# Patient Record
Sex: Female | Born: 1950 | Race: Black or African American | Hispanic: No | Marital: Single | Smoking: Never smoker
Health system: Southern US, Community
[De-identification: ages and names within clinical notes are randomized; demographics above are authoritative.]

## PROBLEM LIST (undated history)

## (undated) DIAGNOSIS — H269 Unspecified cataract: Secondary | ICD-10-CM

## (undated) DIAGNOSIS — K219 Gastro-esophageal reflux disease without esophagitis: Secondary | ICD-10-CM

## (undated) DIAGNOSIS — I10 Essential (primary) hypertension: Secondary | ICD-10-CM

## (undated) DIAGNOSIS — M1990 Unspecified osteoarthritis, unspecified site: Secondary | ICD-10-CM

## (undated) DIAGNOSIS — E785 Hyperlipidemia, unspecified: Secondary | ICD-10-CM

## (undated) DIAGNOSIS — E119 Type 2 diabetes mellitus without complications: Secondary | ICD-10-CM

## (undated) HISTORY — PX: WISDOM TOOTH EXTRACTION: SHX21

## (undated) HISTORY — PX: TONSILLECTOMY: SUR1361

## (undated) HISTORY — DX: Hyperlipidemia, unspecified: E78.5

## (undated) HISTORY — PX: CATARACT EXTRACTION: SUR2

## (undated) HISTORY — DX: Type 2 diabetes mellitus without complications: E11.9

## (undated) HISTORY — DX: Gastro-esophageal reflux disease without esophagitis: K21.9

## (undated) HISTORY — DX: Essential (primary) hypertension: I10

## (undated) HISTORY — DX: Unspecified cataract: H26.9

## (undated) HISTORY — DX: Unspecified osteoarthritis, unspecified site: M19.90

---

## 1986-07-20 HISTORY — PX: TOTAL ABDOMINAL HYSTERECTOMY: SHX209

## 2003-08-11 ENCOUNTER — Emergency Department (HOSPITAL_COMMUNITY): Admission: EM | Admit: 2003-08-11 | Discharge: 2003-08-11 | Payer: Self-pay | Admitting: Internal Medicine

## 2003-10-02 ENCOUNTER — Encounter: Admission: RE | Admit: 2003-10-02 | Discharge: 2003-12-31 | Payer: Self-pay | Admitting: Internal Medicine

## 2004-03-19 ENCOUNTER — Encounter: Admission: RE | Admit: 2004-03-19 | Discharge: 2004-03-19 | Payer: Self-pay | Admitting: Internal Medicine

## 2006-03-16 ENCOUNTER — Encounter: Admission: RE | Admit: 2006-03-16 | Discharge: 2006-03-16 | Payer: Self-pay | Admitting: Internal Medicine

## 2007-08-22 ENCOUNTER — Emergency Department (HOSPITAL_COMMUNITY): Admission: EM | Admit: 2007-08-22 | Discharge: 2007-08-22 | Payer: Self-pay | Admitting: Emergency Medicine

## 2009-08-02 ENCOUNTER — Encounter: Admission: RE | Admit: 2009-08-02 | Discharge: 2009-08-02 | Payer: Self-pay | Admitting: Orthopedic Surgery

## 2013-02-10 ENCOUNTER — Encounter: Payer: Self-pay | Admitting: Internal Medicine

## 2013-12-17 ENCOUNTER — Encounter: Payer: Self-pay | Admitting: Internal Medicine

## 2014-05-23 ENCOUNTER — Encounter: Payer: Self-pay | Admitting: Internal Medicine

## 2014-06-20 ENCOUNTER — Ambulatory Visit (AMBULATORY_SURGERY_CENTER): Payer: Self-pay | Admitting: *Deleted

## 2014-06-20 VITALS — Ht 64.0 in | Wt 250.0 lb

## 2014-06-20 DIAGNOSIS — Z1211 Encounter for screening for malignant neoplasm of colon: Secondary | ICD-10-CM

## 2014-06-20 NOTE — Progress Notes (Signed)
Patient denies any allergies to eggs or soy. Patient denies any problems with anesthesia/sedation, states Hard to wake up after surgery. Patient denies any oxygen use at home and does not take any diet/weight loss medications. EMMI education assisgned to patient on colonoscopy, this was explained and instructions given to patient.

## 2014-07-04 ENCOUNTER — Encounter: Payer: Self-pay | Admitting: Internal Medicine

## 2014-07-04 ENCOUNTER — Ambulatory Visit (AMBULATORY_SURGERY_CENTER): Payer: BC Managed Care – PPO | Admitting: Internal Medicine

## 2014-07-04 VITALS — BP 139/75 | HR 63 | Temp 96.4°F | Resp 21 | Ht 64.0 in | Wt 250.0 lb

## 2014-07-04 DIAGNOSIS — D123 Benign neoplasm of transverse colon: Secondary | ICD-10-CM

## 2014-07-04 DIAGNOSIS — Z1211 Encounter for screening for malignant neoplasm of colon: Secondary | ICD-10-CM

## 2014-07-04 LAB — GLUCOSE, CAPILLARY
Glucose-Capillary: 186 mg/dL — ABNORMAL HIGH (ref 70–99)
Glucose-Capillary: 170 mg/dL — ABNORMAL HIGH (ref 70–99)

## 2014-07-04 MED ORDER — SODIUM CHLORIDE 0.9 % IV SOLN: 500.0000 mL | INTRAVENOUS | Status: DC

## 2014-07-04 MED ORDER — PRAMOXINE-HC 1-1 % EX CREA: TOPICAL_CREAM | CUTANEOUS | Status: DC

## 2014-07-04 NOTE — Progress Notes (Signed)
Called to room to assist during endoscopic procedure.  Patient ID and intended procedure confirmed with present staff. Received instructions for my participation in the procedure from the performing physician.  

## 2014-07-04 NOTE — Progress Notes (Signed)
Report to PACU, RN, vss, BBS= Clear.  

## 2014-07-04 NOTE — Patient Instructions (Signed)
YOU HAD AN ENDOSCOPIC PROCEDURE TODAY AT THE [facility]: Refer to the procedure report that was given to you for any specific questions about what was found during the examination.  If the procedure report does not answer your questions, please call your gastroenterologist to clarify.  If you requested that your care partner not be given the details of your procedure findings, then the procedure report has been included in a sealed envelope for you to review at your convenience later.  YOU SHOULD EXPECT: Some feelings of bloating in the abdomen. Passage of more gas than usual.  Walking can help get rid of the air that was put into your GI tract during the procedure and reduce the bloating. If you had a lower endoscopy (such as a colonoscopy or flexible sigmoidoscopy) you may notice spotting of blood in your stool or on the toilet paper. If you underwent a bowel prep for your procedure, then you may not have a normal bowel movement for a few days.  DIET: Your first meal following the procedure should be a light meal and then it is ok to progress to your normal diet.  A half-sandwich or bowl of soup is an example of a good first meal.  Heavy or fried foods are harder to digest and may make you feel nauseous or bloated.  Likewise meals heavy in dairy and vegetables can cause extra gas to form and this can also increase the bloating.  Drink plenty of fluids but you should avoid alcoholic beverages for 24 hours.  ACTIVITY: Your care partner should take you home directly after the procedure.  You should plan to take it easy, moving slowly for the rest of the day.  You can resume normal activity the day after the procedure however you should NOT DRIVE or use heavy machinery for 24 hours (because of the sedation medicines used during the test).    SYMPTOMS TO REPORT IMMEDIATELY: A gastroenterologist can be reached at any hour.  During normal business hours, 8:30 AM to 5:00 PM Monday through Friday,  call [phone].  After hours and on weekends, please call the GI answering service at [phone] who will take a message and have the physician on call contact you.   Following lower endoscopy (colonoscopy or flexible sigmoidoscopy):  Excessive amounts of blood in the stool  Significant tenderness or worsening of abdominal pains  Swelling of the abdomen that is new, acute  Fever of 100F or higher  FOLLOW UP: If any biopsies were taken you will be contacted by phone or by letter within the next 1-3 weeks.  Call your gastroenterologist if you have not heard about the biopsies in 3 weeks.  Our staff will call the home number listed on your records the next business day following your procedure to check on you and address any questions or concerns that you may have at that time regarding the information given to you following your procedure. This is a courtesy call and so if there is no answer at the home number and we have not heard from you through the emergency physician on call, we will assume that you have returned to your regular daily activities without incident.  SIGNATURES/CONFIDENTIALITY: You and/or your care partner have signed paperwork which will be entered into your electronic medical record.  These signatures attest to the fact that that the information above on your After Visit Summary has been reviewed and is understood.  Full responsibility of the confidentiality of this   discharge information lies with you and/or your care-partner.  Polyp, diverticulosis, and high fiber diet information given. Analpram cream as directed.

## 2014-07-04 NOTE — Op Note (Signed)
[facility] [place]  [address] [place], [address]   COLONOSCOPY PROCEDURE REPORT  PATIENT: Laurie Payne, Laurie Payne  MR#: [id number] BIRTHDATE: 02-13-1951 , 50  yrs. old GENDER: female ENDOSCOPIST: Lafayette Dragon, MD REFERRED BY:Dr  Morreira PROCEDURE DATE:  07/04/2014 PROCEDURE:   Colonoscopy with snare polypectomy First Screening Colonoscopy - Avg.  risk and is 50 yrs.  old or older - No.  Prior Negative Screening - Now for repeat screening. 10 or more years since last screening  History of Adenoma - Now for follow-up colonoscopy & has been > or = to 3 yrs.  N/A  Polyps Removed Today? Yes. ASA CLASS:   Class II INDICATIONS:average risk for colon cancer and prior colonoscopy in 2004 was normal. MEDICATIONS: Monitored anesthesia care and Propofol 300 mg IV  DESCRIPTION OF PROCEDURE:   After the risks benefits and alternatives of the procedure were thoroughly explained, informed consent was obtained.  The digital rectal exam revealed no abnormalities of the rectum.   The LB [id number]  endoscope was introduced through the anus and advanced to the cecum, which was identified by both the appendix and ileocecal valve. No adverse events experienced.   The quality of the prep was good, using MoviPrep  The instrument was then slowly withdrawn as the colon was fully examined.      COLON FINDINGS: There was mild diverticulosis noted in the descending colon with associated muscular hypertrophy and tortuosity.   A semi-pedunculated polyp measuring 9 mm in size was found in the transverse colon.  A polypectomy was performed with a cold snare.  Retroflexed views revealed no abnormalities. The time to cecum=6 minutes 08 seconds.  Withdrawal time=6 minutes 07 seconds.  The scope was withdrawn and the procedure completed. COMPLICATIONS: There were no immediate complications.  ENDOSCOPIC IMPRESSION: 1.   There was mild diverticulosis noted in the descending colon 2.    Semi-pedunculated polyp was found in the transverse colon; polypectomy was performed with a cold snare  RECOMMENDATIONS: 1.  Await pathology results 2.  High fiber diet Recall colonoscopy pending path report  eSigned:  Lafayette Dragon, MD 07/04/2014 10:47 AM   cc:   PATIENT NAME:  Laurie Payne, Laurie Payne MR#: [id number]

## 2014-07-05 ENCOUNTER — Telehealth: Payer: Self-pay | Admitting: *Deleted

## 2014-07-05 NOTE — Telephone Encounter (Signed)
  Follow up Call-  Call back number 07/04/2014  Post procedure Call Back phone  # [phone]  Permission to leave phone message Yes     Patient questions:  Do you have a fever, pain , or abdominal swelling? No. Pain Score  0 *  Have you tolerated food without any problems? Yes.    Have you been able to return to your normal activities? Yes.    Do you have any questions about your discharge instructions: Diet   No. Medications  No. Follow up visit  No.  Do you have questions or concerns about your Care? No.  Actions: * If pain score is 4 or above: No action needed, pain <4.

## 2014-07-10 ENCOUNTER — Encounter: Payer: Self-pay | Admitting: Internal Medicine

## 2014-10-30 ENCOUNTER — Other Ambulatory Visit: Payer: Self-pay | Admitting: Orthopedic Surgery

## 2014-10-30 DIAGNOSIS — M25511 Pain in right shoulder: Secondary | ICD-10-CM

## 2014-11-20 ENCOUNTER — Ambulatory Visit
Admission: RE | Admit: 2014-11-20 | Discharge: 2014-11-20 | Disposition: A | Discharge status: Home / Self Care | Payer: Medicare Other | Source: Ambulatory Visit | Attending: Orthopedic Surgery | Admitting: Orthopedic Surgery

## 2014-11-20 DIAGNOSIS — M25511 Pain in right shoulder: Secondary | ICD-10-CM

## 2014-11-20 MED ORDER — IOHEXOL 180 MG/ML  SOLN
15.0000 mL | Freq: Once | INTRAMUSCULAR | Status: AC | PRN
Start: 2014-11-20 — End: 2014-11-20

## 2014-11-28 ENCOUNTER — Ambulatory Visit: Payer: Medicare Other | Attending: Orthopedic Surgery | Admitting: Physical Therapy

## 2014-11-28 DIAGNOSIS — M25512 Pain in left shoulder: Secondary | ICD-10-CM | POA: Insufficient documentation

## 2014-11-28 DIAGNOSIS — M25612 Stiffness of left shoulder, not elsewhere classified: Secondary | ICD-10-CM | POA: Insufficient documentation

## 2014-11-28 DIAGNOSIS — R29898 Other symptoms and signs involving the musculoskeletal system: Secondary | ICD-10-CM | POA: Diagnosis not present

## 2014-11-28 DIAGNOSIS — R293 Abnormal posture: Secondary | ICD-10-CM | POA: Diagnosis not present

## 2014-11-28 NOTE — Therapy (Addendum)
[place], [name], [address] Phone: [phone]   Fax:  [phone]  Physical Therapy Evaluation  Patient Details  Name: Laurie Payne MRN: [id number] Date of Birth: 18-Apr-1951 Referring Provider:  Meredith Pel, MD  Encounter Date: 11/28/2014      PT End of Session - 11/28/14 1936    Visit Number 1   Number of Visits 16   Date for PT Re-Evaluation 01/23/15   PT Start Time 9357   PT Stop Time 1500   PT Time Calculation (min) 45 min   Activity Tolerance Patient tolerated treatment well;Patient limited by pain   Behavior During Therapy [facility] for tasks assessed/performed      Past Medical History  Diagnosis Date  . Hypertension   . Diabetes mellitus without complication   . Arthritis     Past Surgical History  Procedure Laterality Date  . Cataract extraction    . Total abdominal hysterectomy  1988    There were no vitals filed for this visit.  Visit Diagnosis:  Left shoulder pain - Plan: PT plan of care cert/re-cert  Weakness of left arm - Plan: PT plan of care cert/re-cert  Stiffness of left shoulder joint - Plan: PT plan of care cert/re-cert  Decreased ROM of left shoulder - Plan: PT plan of care cert/re-cert  Abnormal posture - Plan: PT plan of care cert/re-cert      Subjective Assessment - 11/28/14 1431    Subjective pt is a 64 y.o F with CC of R shoulder that started in Septemer of 2015. she reports that symptoms have gotten worse since onset. pt reports that she is R hand dominate.   Limitations Lifting;House hold activities  reaching   How long can you sit comfortably? unlimited   How long can you stand comfortably? 5 min   How long can you walk comfortably? unlimited if she can keep the arm hanging down.    Diagnostic tests 11/20/2014 MRI per pt report  a small hole in the supraspinatus.    Patient Stated Goals to get the arm feeling better.    Currently in Pain? Yes    Pain Score 9    Pain Location Shoulder   Pain Orientation Right   Pain Descriptors / Indicators Sharp;Shooting   Pain Type Chronic pain   Pain Radiating Towards refers to the R elbow and intermittent wrist   Pain Onset More than a month ago   Pain Frequency Constant   Aggravating Factors  laying, lifting carrying, sitting with elbow on the chair.   Pain Relieving Factors ice, heat             [facility] PT Assessment - 11/28/14 1436    Assessment   Medical Diagnosis r frozen shoulder   Onset Date --  September 2015   Next MD Visit --  1 month   Prior Therapy yes  low back (many years ago)   Precautions   Precautions None   Restrictions   Weight Bearing Restrictions No   Balance Screen   Has the patient fallen in the past 6 months Yes   How many times? 1   Has the patient had a decrease in activity level because of a fear of falling?  No   Is the patient reluctant to leave their home because of a fear of falling?  No   Home Environment   Living Enviornment Private residence   Living Arrangements Alone   Type of Home  House   Home Access Level entry   Home Layout One level   Prior Function   Level of Independence Independent with homemaking with ambulation;Independent with basic ADLs;Independent with gait;Independent with transfers   Vocation Retired   Leisure aerobics, line dancing, chair yoga, sowing, crocheting.   Cognition   Overall Cognitive Status Within Functional Limits for tasks assessed   Observation/Other Assessments   Focus on Therapeutic Outcomes (FOTO)  63% limited  predicted 40% limited   Posture/Postural Control   Posture/Postural Control Postural limitations   Postural Limitations Forward head;Rounded Shoulders   ROM / Strength   AROM / PROM / Strength AROM;PROM;Strength   AROM   Overall AROM Comments L UE within funcitonal limites   AROM Assessment Site Shoulder   Right Shoulder Extension 30 Degrees   Right Shoulder Flexion 140 Degrees  pain  during and at endrange   Right Shoulder ABduction 98 Degrees  with pain at endrange   Right Shoulder Internal Rotation 40 Degrees   Right Shoulder External Rotation 45 Degrees   PROM   Overall PROM Comments L UE WFL    Right Shoulder Extension 33 Degrees   Right Shoulder Flexion 160 Degrees  pain during    Right Shoulder ABduction 100 Degrees  pain during motion   Right Shoulder Internal Rotation 48 Degrees   Right Shoulder External Rotation 45 Degrees   Strength   Right/Left Shoulder Right;Left   Right Shoulder Flexion 3/5   Right Shoulder Extension 3+/5   Right Shoulder ABduction 3-/5   Right Shoulder Internal Rotation 3+/5   Right Shoulder External Rotation 3+/5   Left Shoulder Flexion 5/5   Left Shoulder Extension 5/5   Left Shoulder ABduction 5/5   Left Shoulder Internal Rotation 5/5   Left Shoulder External Rotation 5/5   Left Shoulder Horizontal ABduction 5/5   Left Shoulder Horizontal ADduction 5/5   Grip (lbs) 73  L   Grip (lbs) 54  R   Palpation   Palpation tenderness located at the muscle belly of the supraspintus  pt had difficulty telling where the pain was   Special Tests    Special Tests Rotator Cuff Impingement   Rotator Cuff Impingment tests Neer impingement test;Hawkins- Merrilyn Puma test;Empty Can test;Full Can test;Drop Arm test   Neer Impingement test    Findings Positive   Hawkins-Kennedy test   Findings Negative   Empty Can test   Findings Positive   Side Right   Full Can test   Findings Positive   Drop Arm test   Findings Negative                           PT Education - 11/28/14 1936    Education provided Yes   Education Details evaluation findings, POC, goals, HEP   Person(s) Educated Patient   Methods Explanation   Comprehension Verbalized understanding          PT Short Term Goals - 11/28/14 1945    PT SHORT TERM GOAL #1   Title pt will be I with basic HEP 12/29/2014   Time 4   Period Weeks   Status New   PT  SHORT TERM GOAL #2   Title pt will increase R shoulder flexion and abduction by > 15 degrees to assist with functional progression 12/29/2014   Time 4   Period Weeks   Status New   PT SHORT TERM GOAL #3   Title pt will decrease R shoulder  pain to <6/10 to help with exercise progression and ADLs 12/29/2014   Time 4   Period Weeks   Status New   PT SHORT TERM GOAL #4   Title she will increase R shoulder strength to 4-/5 to help with ADLs 12/29/2014   Time 4   Period Weeks   Status New   PT SHORT TERM GOAL #5   Title she will be able to verbalize and demonstrate techniques to reduce inflammation via RICE method 12/29/2014   Time 4   Period Weeks   Status New           PT Long Term Goals - 20-Dec-2014 1948    PT LONG TERM GOAL #1   Title pt will be I with advanced HEP (01/23/15)   Time 8   Period Weeks   Status New   PT LONG TERM GOAL #2   Title pt will increase R overall Shoulder AROM to [facility] compared bil to assist with personal hygiene and ADLs (01/23/15)   Time 8   Period Weeks   Status New   PT LONG TERM GOAL #3   Title pt will decrase pain to <4/10 during and following overhead lifting of >5# to assist wtih ADLs (01/23/15)   Time 8   Period Weeks   Status New   PT LONG TERM GOAL #4   Title pt will increase R shoulder strength to >4+/5 to help with household cleaning, and lifting and carrying activities (01/23/15)   Time 8   Period Weeks   Status New   PT LONG TERM GOAL #5   Title pt will increase FOTO score to >60% to assist with functional capacity (01/23/15)   Time 8   Period Weeks   Status New   Additional Long Term Goals   Additional Long Term Goals Yes               Plan - December 20, 2014 1937    Clinical Impression Statement Laurie Payne presents to OPPT with CC of R shoulder pain that has been going on for a couple months, and the pt is R hand dominant. She demonstrates limited AROM and PROM compared bil with pain noted at endrange. pt reported diffiuclty tryin to find a  spot that was more tender or sore, upon palpation she was tender along the muscle belly of the supraspinatus.  MMT revealed general R shoulder weakness in all planes with the most defecit in flexion being 3/5 and abduction being 3-/5 seconary to pain. Special testing was postive with empty/full can with empty can being worse. She would benefit from skilled physical therapy to maximize her functiona and decrease pain by addressing the impairments listed.    Pt will benefit from skilled therapeutic intervention in order to improve on the following deficits Decreased strength;Pain;Impaired UE functional use;Decreased mobility;Decreased activity tolerance;Decreased range of motion;Increased fascial restricitons;Improper body mechanics;Decreased endurance;Impaired flexibility   Rehab Potential Good   PT Frequency 2x / week   PT Duration 8 weeks   PT Treatment/Interventions ADLs/Self Care Home Management;Ultrasound;Manual techniques;Therapeutic activities;Therapeutic exercise;Cryotherapy;Electrical Stimulation;Neuromuscular re-education;Passive range of motion;Dry needling;Patient/family education;Moist Heat   PT Next Visit Plan Assess response to HEP, modalities PRN, Shoulder AAROM/PROM,    PT Home Exercise Plan wand flexion, abduction, pendulums   Consulted and Agree with Plan of Care Patient          G-Codes - Dec 20, 2014 2000    Functional Assessment Tool Used FOTO 63% limited   Functional Limitation Carrying, moving and handling objects  Carrying, Moving and Handling Objects Current Status [phone]) At least 60 percent but less than 80 percent impaired, limited or restricted   Carrying, Moving and Handling Objects Goal Status ([id number]) At least 20 percent but less than 40 percent impaired, limited or restricted       Problem List There are no active problems to display for this patient.  Starr Lake PT, DPT, LAT, ATC  11/28/2014  8:01 PM   [facility] [address] [place], [name], [address] Phone: [phone]   Fax:  [phone]

## 2014-11-28 NOTE — Patient Instructions (Signed)
   Akyah Lagrange PT, DPT, LAT, ATC  [facility] Phone: [phone]     

## 2014-12-03 ENCOUNTER — Ambulatory Visit: Payer: Medicare Other | Admitting: Physical Therapy

## 2014-12-03 DIAGNOSIS — M25512 Pain in left shoulder: Secondary | ICD-10-CM | POA: Diagnosis not present

## 2014-12-03 DIAGNOSIS — M25511 Pain in right shoulder: Secondary | ICD-10-CM

## 2014-12-03 DIAGNOSIS — R293 Abnormal posture: Secondary | ICD-10-CM

## 2014-12-03 DIAGNOSIS — M25621 Stiffness of right elbow, not elsewhere classified: Secondary | ICD-10-CM

## 2014-12-03 DIAGNOSIS — R29898 Other symptoms and signs involving the musculoskeletal system: Secondary | ICD-10-CM

## 2014-12-03 DIAGNOSIS — M25611 Stiffness of right shoulder, not elsewhere classified: Secondary | ICD-10-CM

## 2014-12-03 NOTE — Patient Instructions (Signed)
Remove tape if irritating 

## 2014-12-03 NOTE — Therapy (Signed)
[place], [name], [address] Phone: [phone]   Fax:  [phone]  Physical Therapy Treatment  Patient Details  Name: Laurie Payne MRN: [id number] Date of Birth: [birth year] Referring Provider:  Jilda Panda, MD  Encounter Date: 12/03/2014      PT End of Session - 12/03/14 1259    Visit Number 2   Number of Visits 16   Date for PT Re-Evaluation 01/23/15   PT Start Time 1146   PT Stop Time 1233   PT Time Calculation (min) 47 min   Activity Tolerance Patient tolerated treatment well;Patient limited by pain      Past Medical History  Diagnosis Date  . Hypertension   . Diabetes mellitus without complication   . Arthritis     Past Surgical History  Procedure Laterality Date  . Cataract extraction    . Total abdominal hysterectomy  1988    There were no vitals filed for this visit.  Visit Diagnosis:  Abnormal posture  Right shoulder pain  Weakness of right arm  Stiffness of right upper arm joint  Decreased ROM of right shoulder      Subjective Assessment - 12/03/14 1154    Subjective 8/10 with lifting, reaching.  Exercises have helped her sleep better.Stil can't sleep on that shoulder   Currently in Pain? No/denies   Pain Score 8    Pain Location Shoulder   Pain Orientation Right   Pain Descriptors / Indicators Sharp;Shooting;Aching   Pain Radiating Towards down arm   Aggravating Factors  driving, lifting, classes, sleeping on it, reaching and lifting, flushing the commode.   Pain Relieving Factors exercises, hot shower heating pad help for the moment.   Medication.     Multiple Pain Sites No                         [facility] Adult PT Treatment/Exercise - 12/03/14 1150    Posture/Postural Control   Posture Comments education about posture and shoulder,skeleton used pillow positioning for improved blood flow to area.   Shoulder Exercises: Standing   Flexion --   cane flexion, AA with cues for speed, slower posture   ABduction --  cane with cues for speed posture   Other Standing Exercises pendelum practiced, good technique.   Manual Therapy   Manual therapy comments soft tissur work Rt shoulder. tender areas softened.  Skin washed and dried (from lotion) Kinesiotex taping for inhibition of deltoid, suprascapulatis, and posture correction                PT Education - 12/03/14 1310    Education provided Yes   Education Details posetur ed, how it affects shoulder function, wear and tear.   Person(s) Educated Patient   Methods Explanation;Demonstration  skeleton used   Comprehension Verbalized understanding          PT Short Term Goals - 12/03/14 1302    PT SHORT TERM GOAL #1   Title pt will be I with basic HEP 12/29/2014   Time 4   Period Weeks   Status Achieved   PT SHORT TERM GOAL #2   Title pt will increase R shoulder flexion and abduction by > 15 degrees to assist with functional progression 12/29/2014   Time 4   Period Weeks   Status On-going   PT SHORT TERM GOAL #3   Title pt will decrease R shoulder pain to <6/10 to help with exercise  progression and ADLs 12/29/2014   Time 4   Period Weeks   Status On-going   PT SHORT TERM GOAL #4   Title she will increase R shoulder strength to 4-/5 to help with ADLs 12/29/2014   Time 4   Period Weeks   Status On-going           PT Long Term Goals - 11/28/14 1948    PT LONG TERM GOAL #1   Title pt will be I with advanced HEP (01/23/15)   Time 8   Period Weeks   Status New   PT LONG TERM GOAL #2   Title pt will increase R overall Shoulder AROM to [facility] compared bil to assist with personal hygiene and ADLs (01/23/15)   Time 8   Period Weeks   Status New   PT LONG TERM GOAL #3   Title pt will decrase pain to <4/10 during and following overhead lifting of >5# to assist wtih ADLs (01/23/15)   Time 8   Period Weeks   Status New   PT LONG TERM GOAL #4   Title pt will increase R  shoulder strength to >4+/5 to help with household cleaning, and lifting and carrying activities (01/23/15)   Time 8   Period Weeks   Status New   PT LONG TERM GOAL #5   Title pt will increase FOTO score to >60% to assist with functional capacity (01/23/15)   Time 8   Period Weeks   Status New   Additional Long Term Goals   Additional Long Term Goals Yes               Plan - 12/03/14 1301    Consulted and Agree with Plan of Care Patient    Next visit, doorway stretch, ER, ROW, AAROM    Problem List There are no active problems to display for this patient.   [facility] 12/03/2014, 1:12 PM  [facility] [address] [place], [name], [address] Phone: [phone]   Fax:  [phone]

## 2014-12-05 ENCOUNTER — Ambulatory Visit: Payer: Medicare Other | Admitting: Physical Therapy

## 2014-12-05 DIAGNOSIS — R293 Abnormal posture: Secondary | ICD-10-CM

## 2014-12-05 DIAGNOSIS — R29898 Other symptoms and signs involving the musculoskeletal system: Secondary | ICD-10-CM

## 2014-12-05 DIAGNOSIS — M25512 Pain in left shoulder: Secondary | ICD-10-CM | POA: Diagnosis not present

## 2014-12-05 DIAGNOSIS — M25611 Stiffness of right shoulder, not elsewhere classified: Secondary | ICD-10-CM

## 2014-12-05 DIAGNOSIS — M25511 Pain in right shoulder: Secondary | ICD-10-CM

## 2014-12-05 DIAGNOSIS — M25621 Stiffness of right elbow, not elsewhere classified: Secondary | ICD-10-CM

## 2014-12-05 NOTE — Therapy (Signed)
[place], [name], [address] Phone: [phone]   Fax:  [phone]  Physical Therapy Treatment  Patient Details  Name: Laurie Payne MRN: [id number] Date of Birth: 03-Nov-1950 Referring Provider:  Jilda Panda, MD  Encounter Date: 12/05/2014    Past Medical History  Diagnosis Date  . Hypertension   . Diabetes mellitus without complication   . Arthritis     Past Surgical History  Procedure Laterality Date  . Cataract extraction    . Total abdominal hysterectomy  1988    There were no vitals filed for this visit.  Visit Diagnosis:  Right shoulder pain  Weakness of right arm  Stiffness of right upper arm joint  Abnormal posture  Decreased ROM of right shoulder      Subjective Assessment - 12/05/14 1335    Subjective pt reports overall that she is doing well since the last visit. "It can't be more than a 3/10 but otherwise its not bad"   Currently in Pain? Yes   Pain Score 3    Pain Location Shoulder   Pain Orientation Right   Pain Descriptors / Indicators Aching   Pain Type Chronic pain   Pain Onset More than a month ago   Pain Frequency Intermittent                         [facility] Adult PT Treatment/Exercise - 12/05/14 1337    Shoulder Exercises: Supine   Protraction AROM;Strengthening;Right;10 reps  2 sets    Shoulder Exercises: Standing   Extension AROM;Strengthening;Both;10 reps;Theraband  2 sets   Theraband Level (Shoulder Extension) Level 2 (Red)   Row AROM;Strengthening;Both;10 reps;Theraband  2 sets    Theraband Level (Shoulder Row) Level 2 (Red)   Retraction AROM;Strengthening;Both;10 reps;Theraband  2 sets with ER    Theraband Level (Shoulder Retraction) Level 1 (Yellow)   Shoulder Exercises: ROM/Strengthening   UBE (Upper Arm Bike) L1 x 6 min  alt dir every 3 min   Other ROM/Strengthening Exercises UE Ranger 2 x 15 ea. with flexion / ext   Modalities   Modalities Cryotherapy;Electrical Stimulation   Cryotherapy   Number Minutes Cryotherapy 10 Minutes   Cryotherapy Location Shoulder   Type of Cryotherapy Ice pack   Electrical Stimulation   Electrical Stimulation Location R shoulder   Electrical Stimulation Action IFC   Electrical Stimulation Parameters 10 min to tolerance   Electrical Stimulation Goals Pain   Manual Therapy   Manual therapy comments grade 2-3 scapular and GH mobilzation in all directions                  PT Short Term Goals - 12/03/14 1302    PT SHORT TERM GOAL #1   Title pt will be I with basic HEP 12/29/2014   Time 4   Period Weeks   Status Achieved   PT SHORT TERM GOAL #2   Title pt will increase R shoulder flexion and abduction by > 15 degrees to assist with functional progression 12/29/2014   Time 4   Period Weeks   Status On-going   PT SHORT TERM GOAL #3   Title pt will decrease R shoulder pain to <6/10 to help with exercise progression and ADLs 12/29/2014   Time 4   Period Weeks   Status On-going   PT SHORT TERM GOAL #4   Title she will increase R shoulder strength to 4-/5 to help with ADLs 12/29/2014  Time 4   Period Weeks   Status On-going           PT Long Term Goals - 11/28/14 1948    PT LONG TERM GOAL #1   Title pt will be I with advanced HEP (01/23/15)   Time 8   Period Weeks   Status New   PT LONG TERM GOAL #2   Title pt will increase R overall Shoulder AROM to [facility] compared bil to assist with personal hygiene and ADLs (01/23/15)   Time 8   Period Weeks   Status New   PT LONG TERM GOAL #3   Title pt will decrase pain to <4/10 during and following overhead lifting of >5# to assist wtih ADLs (01/23/15)   Time 8   Period Weeks   Status New   PT LONG TERM GOAL #4   Title pt will increase R shoulder strength to >4+/5 to help with household cleaning, and lifting and carrying activities (01/23/15)   Time 8   Period Weeks   Status New   PT LONG TERM GOAL #5   Title pt  will increase FOTO score to >60% to assist with functional capacity (01/23/15)   Time 8   Period Weeks   Status New   Additional Long Term Goals   Additional Long Term Goals Yes               Plan - 12/05/14 1509    Clinical Impression Statement Alejah reports that she is doing better since the last visit, but continues have pain during AROM with flexion and abduction. She reports that she likes the taping and that it is helping. She tolerated exercises well witn minimal complaint of pain in the shoulder during ER and bicep curls. Plan to progress with strengthening as tolerated.    PT Next Visit Plan , modalities PRN, Shoulder AAROM/PROM,   PT Home Exercise Plan wand flexion, abduction, pendulums   Consulted and Agree with Plan of Care Patient        Problem List There are no active problems to display for this patient.  Starr Lake PT, DPT, LAT, ATC  12/05/2014  3:14 PM    [facility] [address] [place], [name], [address] Phone: [phone]   Fax:  [phone]

## 2014-12-10 ENCOUNTER — Ambulatory Visit: Payer: Medicare Other | Admitting: Physical Therapy

## 2014-12-10 DIAGNOSIS — R29898 Other symptoms and signs involving the musculoskeletal system: Secondary | ICD-10-CM

## 2014-12-10 DIAGNOSIS — M25621 Stiffness of right elbow, not elsewhere classified: Secondary | ICD-10-CM

## 2014-12-10 DIAGNOSIS — M25512 Pain in left shoulder: Secondary | ICD-10-CM | POA: Diagnosis not present

## 2014-12-10 DIAGNOSIS — M25511 Pain in right shoulder: Secondary | ICD-10-CM

## 2014-12-10 DIAGNOSIS — M25611 Stiffness of right shoulder, not elsewhere classified: Secondary | ICD-10-CM

## 2014-12-10 DIAGNOSIS — R293 Abnormal posture: Secondary | ICD-10-CM

## 2014-12-10 NOTE — Therapy (Signed)
[place], [name], [address] Phone: [phone]   Fax:  [phone]  Physical Therapy Treatment  Patient Details  Name: Laurie Payne MRN: [id number] Date of Birth: 1950-08-14 Referring Provider:  Jilda Panda, MD  Encounter Date: 12/10/2014      PT End of Session - 12/10/14 1201    Visit Number 4   Number of Visits 16   Date for PT Re-Evaluation 01/23/15   PT Start Time 1100   PT Stop Time 1158   PT Time Calculation (min) 58 min   Activity Tolerance Patient tolerated treatment well   Behavior During Therapy [facility] for tasks assessed/performed      Past Medical History  Diagnosis Date  . Hypertension   . Diabetes mellitus without complication   . Arthritis     Past Surgical History  Procedure Laterality Date  . Cataract extraction    . Total abdominal hysterectomy  1988    There were no vitals filed for this visit.  Visit Diagnosis:  Right shoulder pain  Weakness of right arm  Stiffness of right upper arm joint  Abnormal posture  Decreased ROM of right shoulder      Subjective Assessment - 12/10/14 1107    Subjective Pt reports that she has been doing better since the last visit, but conitnues to have intermittent pain in the shoulder with reaching and grabbing   Currently in Pain? Yes   Pain Score 4    Pain Location Shoulder   Pain Orientation Right   Pain Descriptors / Indicators Aching   Pain Type Chronic pain   Pain Onset More than a month ago   Pain Frequency Intermittent   Aggravating Factors  lifting, driving, sleeping on it, and reaching   Pain Relieving Factors exercises, hot shower and heating pad.            [facility] PT Assessment - 12/10/14 0001    AROM   Right Shoulder Flexion 132 Degrees   Right Shoulder ABduction 130 Degrees                     [facility] Adult PT Treatment/Exercise - 12/10/14 0001    Shoulder Exercises: Standing   External  Rotation AROM;Strengthening;10 reps;Theraband  2    Theraband Level (Shoulder External Rotation) Level 2 (Red)   Internal Rotation AROM;Strengthening;Right;Theraband;10 reps  2 x   Theraband Level (Shoulder Internal Rotation) Level 2 (Red)   Flexion AROM;Strengthening;Right  scaption   Extension AROM;Strengthening;Both;10 reps;Theraband   Theraband Level (Shoulder Extension) Level 3 (Green)   Row AROM;Strengthening;Both;10 reps;Theraband   Theraband Level (Shoulder Row) Level 3 (Green)   Retraction AROM;Strengthening;Both;10 reps;Theraband   Theraband Level (Shoulder Retraction) Level 2 (Red)   Shoulder Exercises: Pulleys   Flexion 2 minutes   ABduction 2 minutes   Shoulder Exercises: ROM/Strengthening   UBE (Upper Arm Bike) L1.5 x 6 min   Cryotherapy   Number Minutes Cryotherapy 10 Minutes   Cryotherapy Location Shoulder   Type of Cryotherapy Ice pack   Electrical Stimulation   Electrical Stimulation Location R shoulder   Electrical Stimulation Action IFC   Electrical Stimulation Parameters 10 min 100% scan to tolerance   Electrical Stimulation Goals Pain   Manual Therapy   Manual therapy comments grade 2-3 scapular and GH mobilzation in all directions                PT Education - 12/10/14 1244    Education provided Yes  Education Details scaption exercises, and bicep stretch   Person(s) Educated Patient   Methods Explanation   Comprehension Verbalized understanding          PT Short Term Goals - 12/03/14 1302    PT SHORT TERM GOAL #1   Title pt will be I with basic HEP 12/29/2014   Time 4   Period Weeks   Status Achieved   PT SHORT TERM GOAL #2   Title pt will increase R shoulder flexion and abduction by > 15 degrees to assist with functional progression 12/29/2014   Time 4   Period Weeks   Status On-going   PT SHORT TERM GOAL #3   Title pt will decrease R shoulder pain to <6/10 to help with exercise progression and ADLs 12/29/2014   Time 4   Period  Weeks   Status On-going   PT SHORT TERM GOAL #4   Title she will increase R shoulder strength to 4-/5 to help with ADLs 12/29/2014   Time 4   Period Weeks   Status On-going           PT Long Term Goals - 11/28/14 1948    PT LONG TERM GOAL #1   Title pt will be I with advanced HEP (01/23/15)   Time 8   Period Weeks   Status New   PT LONG TERM GOAL #2   Title pt will increase R overall Shoulder AROM to [facility] compared bil to assist with personal hygiene and ADLs (01/23/15)   Time 8   Period Weeks   Status New   PT LONG TERM GOAL #3   Title pt will decrase pain to <4/10 during and following overhead lifting of >5# to assist wtih ADLs (01/23/15)   Time 8   Period Weeks   Status New   PT LONG TERM GOAL #4   Title pt will increase R shoulder strength to >4+/5 to help with household cleaning, and lifting and carrying activities (01/23/15)   Time 8   Period Weeks   Status New   PT LONG TERM GOAL #5   Title pt will increase FOTO score to >60% to assist with functional capacity (01/23/15)   Time 8   Period Weeks   Status New   Additional Long Term Goals   Additional Long Term Goals Yes               Plan - 12/10/14 1203    Clinical Impression Statement Laurie Payne present to therapy today with report that she feels she is getting better but continues to demonstrate limited AROM at 130 degrees abd and 138 of flexion with pain at end range. she tolerated exercises well today with report of some tenderness during scaption exercises which she reported that she felt better after the first exercise. plan to continue with treatment as tolerated.    PT Next Visit Plan , modalities PRN, Shoulder AAROM/PROM,   PT Home Exercise Plan scaption exercises, and bicep stretch        Problem List There are no active problems to display for this patient.  Starr Lake PT, DPT, LAT, ATC  12/10/2014  12:47 PM   [facility] [address] [place], [name], [address] Phone: [phone]   Fax:  [phone]

## 2014-12-10 NOTE — Patient Instructions (Signed)
   Michele Kerlin PT, DPT, LAT, ATC  [facility] Phone: [phone]     

## 2014-12-12 ENCOUNTER — Ambulatory Visit: Payer: Medicare Other | Admitting: Physical Therapy

## 2014-12-12 DIAGNOSIS — M25611 Stiffness of right shoulder, not elsewhere classified: Secondary | ICD-10-CM

## 2014-12-12 DIAGNOSIS — R293 Abnormal posture: Secondary | ICD-10-CM

## 2014-12-12 DIAGNOSIS — M25621 Stiffness of right elbow, not elsewhere classified: Secondary | ICD-10-CM

## 2014-12-12 DIAGNOSIS — R29898 Other symptoms and signs involving the musculoskeletal system: Secondary | ICD-10-CM

## 2014-12-12 DIAGNOSIS — M25512 Pain in left shoulder: Secondary | ICD-10-CM | POA: Diagnosis not present

## 2014-12-12 DIAGNOSIS — M25511 Pain in right shoulder: Secondary | ICD-10-CM

## 2014-12-12 NOTE — Therapy (Addendum)
[place], [name], [address] Phone: [phone]   Fax:  [phone]  Physical Therapy Treatment  Patient Details  Name: Laurie Payne MRN: [id number] Date of Birth: 02-08-1951 Referring Provider:  Jilda Panda, MD  Encounter Date: 12/12/2014      PT End of Session - 12/12/14 1536    Visit Number 5   Number of Visits 16   Date for PT Re-Evaluation 01/23/15   PT Start Time 4742   PT Stop Time 1520   PT Time Calculation (min) 65 min   Activity Tolerance Patient tolerated treatment well   Behavior During Therapy [facility] for tasks assessed/performed      Past Medical History  Diagnosis Date  . Hypertension   . Diabetes mellitus without complication   . Arthritis     Past Surgical History  Procedure Laterality Date  . Cataract extraction    . Total abdominal hysterectomy  1988    There were no vitals filed for this visit.  Visit Diagnosis:  Right shoulder pain  Weakness of right arm  Stiffness of right upper arm joint  Abnormal posture  Decreased ROM of right shoulder      Subjective Assessment - 12/12/14 1421    Subjective " I was feeling good yesterday and had no pain and was able to mow the lawn." she reports some soreness today with report of doing an exercise class today.    Currently in Pain? Yes   Pain Score 2    Pain Location Shoulder   Pain Orientation Right   Pain Descriptors / Indicators Aching   Pain Type Chronic pain   Pain Onset More than a month ago   Pain Frequency Intermittent                         [facility] Adult PT Treatment/Exercise - 12/12/14 1424    Shoulder Exercises: Standing   External Rotation AROM;Strengthening;10 reps;Theraband   Theraband Level (Shoulder External Rotation) Level 2 (Red)   Internal Rotation AROM;Strengthening;Right;Theraband;10 reps   Theraband Level (Shoulder Internal Rotation) Level 2 (Red)   Flexion  AROM;Strengthening;Right   Theraband Level (Shoulder Flexion) Level 2 (Red)   Extension AROM;Strengthening;Both;10 reps;Theraband   Theraband Level (Shoulder Extension) Level 4 (Blue)   Row AROM;Strengthening;Both;10 reps;Theraband   Theraband Level (Shoulder Row) Level 4 (Blue)   Retraction AROM;Strengthening;Both;10 reps;Theraband   Theraband Level (Shoulder Retraction) Level 2 (Red)   Shoulder Exercises: ROM/Strengthening   UBE (Upper Arm Bike) L1.5 x 8 min  alt dir every 2 min   Other ROM/Strengthening Exercises UE Ranger 2 x 15 ea. with flexion / abd   Shoulder Exercises: Body Blade   Other Body Blade Exercises IR/ER 3 x 10 sec   Cryotherapy   Number Minutes Cryotherapy 10 Minutes   Cryotherapy Location Shoulder   Type of Cryotherapy Ice pack   Electrical Stimulation   Electrical Stimulation Location R shoulder   Electrical Stimulation Action IFC   Electrical Stimulation Parameters 10 min   Manual Therapy   Manual therapy comments grade 2-3 scapular and [place] mobilzation in all directions                PT Education - 12/12/14 1536    Education provided No          PT Short Term Goals - 12/03/14 1302    PT SHORT TERM GOAL #1   Title pt will be I with basic HEP  12/29/2014   Time 4   Period Weeks   Status Achieved   PT SHORT TERM GOAL #2   Title pt will increase R shoulder flexion and abduction by > 15 degrees to assist with functional progression 12/29/2014   Time 4   Period Weeks   Status On-going   PT SHORT TERM GOAL #3   Title pt will decrease R shoulder pain to <6/10 to help with exercise progression and ADLs 12/29/2014   Time 4   Period Weeks   Status On-going   PT SHORT TERM GOAL #4   Title she will increase R shoulder strength to 4-/5 to help with ADLs 12/29/2014   Time 4   Period Weeks   Status On-going           PT Long Term Goals - 11/28/14 1948    PT LONG TERM GOAL #1   Title pt will be I with advanced HEP (01/23/15)   Time 8   Period  Weeks   Status New   PT LONG TERM GOAL #2   Title pt will increase R overall Shoulder AROM to [facility] compared bil to assist with personal hygiene and ADLs (01/23/15)   Time 8   Period Weeks   Status New   PT LONG TERM GOAL #3   Title pt will decrase pain to <4/10 during and following overhead lifting of >5# to assist wtih ADLs (01/23/15)   Time 8   Period Weeks   Status New   PT LONG TERM GOAL #4   Title pt will increase R shoulder strength to >4+/5 to help with household cleaning, and lifting and carrying activities (01/23/15)   Time 8   Period Weeks   Status New   PT LONG TERM GOAL #5   Title pt will increase FOTO score to >60% to assist with functional capacity (01/23/15)   Time 8   Period Weeks   Status New   Additional Long Term Goals   Additional Long Term Goals Yes               Plan - 12/12/14 1537    Clinical Impression Statement Wylene reports that she has been doing better since the last visit with no report of pain.  today she did a class and reported feeling more sore today due to the class. She was able to tolerate all exercises well  with only complaint with muscle soreness during shoulder ER and body blade. plan to progress with strengthening and mobility.    PT Next Visit Plan , modalities PRN, Shoulder AAROM/PROM, and strengthening, manual        Problem List There are no active problems to display for this patient.  Starr Lake PT, DPT, LAT, ATC  12/12/2014  3:45 PM      [facility] [address] [place], [name], [address] Phone: [phone]   Fax:  [phone]

## 2014-12-18 ENCOUNTER — Ambulatory Visit: Payer: Medicare Other | Admitting: Physical Therapy

## 2014-12-18 DIAGNOSIS — M25511 Pain in right shoulder: Secondary | ICD-10-CM

## 2014-12-18 DIAGNOSIS — M25611 Stiffness of right shoulder, not elsewhere classified: Secondary | ICD-10-CM

## 2014-12-18 DIAGNOSIS — M25621 Stiffness of right elbow, not elsewhere classified: Secondary | ICD-10-CM

## 2014-12-18 DIAGNOSIS — R29898 Other symptoms and signs involving the musculoskeletal system: Secondary | ICD-10-CM

## 2014-12-18 DIAGNOSIS — M25512 Pain in left shoulder: Secondary | ICD-10-CM | POA: Diagnosis not present

## 2014-12-18 DIAGNOSIS — R293 Abnormal posture: Secondary | ICD-10-CM

## 2014-12-18 NOTE — Therapy (Signed)
[place], [name], [address] Phone: [phone]   Fax:  [phone]  Physical Therapy Treatment  Patient Details  Name: Laurie Payne MRN: [id number] Date of Birth: [birth year] Referring Provider:  Jilda Panda, MD  Encounter Date: 12/18/2014      PT End of Session - 12/18/14 1010    Visit Number 6   Number of Visits 16   Date for PT Re-Evaluation 01/23/15   PT Start Time 0800   PT Stop Time 0858   PT Time Calculation (min) 58 min   Activity Tolerance Patient tolerated treatment well   Behavior During Therapy [facility] for tasks assessed/performed      Past Medical History  Diagnosis Date  . Hypertension   . Diabetes mellitus without complication   . Arthritis     Past Surgical History  Procedure Laterality Date  . Cataract extraction    . Total abdominal hysterectomy  1988    There were no vitals filed for this visit.  Visit Diagnosis:  Right shoulder pain  Weakness of right arm  Stiffness of right upper arm joint  Abnormal posture  Decreased ROM of right shoulder      Subjective Assessment - 12/18/14 0804    Subjective "I have been doing pretty good until yesterday and had increased to a 5/10 and had to take a pain pill" Today she reports feeling better.    Currently in Pain? Yes   Pain Score 0-No pain   Pain Location Shoulder   Pain Orientation Right   Pain Descriptors / Indicators Aching   Pain Type Chronic pain   Pain Onset More than a month ago   Pain Frequency Intermittent   Aggravating Factors  lifting, driving, sleeping/laying down   Pain Relieving Factors exercises, hot shower, and heating pad            [facility] PT Assessment - 12/18/14 0001    AROM   Right Shoulder Flexion 155 Degrees  assessed in supine   Right Shoulder ABduction 131 Degrees  assess in supine after tx today                     [facility] Adult PT Treatment/Exercise - 12/18/14 0807     Shoulder Exercises: Supine   Protraction AROM;Strengthening;Right;15 reps   Shoulder Exercises: Standing   External Rotation AROM;Strengthening;10 reps;Theraband   Theraband Level (Shoulder External Rotation) Level 2 (Red);Level 3 (Green)  1 set with red, 1 x 8 with green   Internal Rotation AROM;Strengthening;Right;Theraband;10 reps   Theraband Level (Shoulder Internal Rotation) Level 3 (Green)   Flexion AROM;Strengthening;Right   Theraband Level (Shoulder Flexion) Level 2 (Red)   Extension AROM;Strengthening;Both;Theraband;10 reps  3 sets   Theraband Level (Shoulder Extension) Level 4 (Blue)  2 setsw   Row AROM;Strengthening;Both;Theraband;10 reps  3 sets   Theraband Level (Shoulder Row) Level 4 (Blue)   Retraction AROM;Strengthening;Both;10 reps;Theraband   Theraband Level (Shoulder Retraction) Level 2 (Red)   Shoulder Exercises: ROM/Strengthening   UBE (Upper Arm Bike) L1.5 x 8 min  alt dir every 2 min   Other ROM/Strengthening Exercises UE Ranger 2 x 15 ea. with flexion / abd   Shoulder Exercises: Body Blade   Other Body Blade Exercises IR/ER 3 x 10 sec   Cryotherapy   Number Minutes Cryotherapy 10 Minutes   Cryotherapy Location Shoulder   Type of Cryotherapy Ice pack   Electrical Stimulation   Electrical Stimulation Location R shoulder  Electrical Stimulation Action IFC   Electrical Stimulation Parameters 10 min, 100% scan, to toleance   Electrical Stimulation Goals Pain   Manual Therapy   Manual Therapy Myofascial release   Manual therapy comments grade 2-3 scapular and [place] mobilzation in all directions                PT Education - 12/18/14 1305    Education provided Yes   Education Details progressing HEP as tolerated   Person(s) Educated Patient   Methods Explanation   Comprehension Verbalized understanding          PT Short Term Goals - 12/18/14 1308    PT SHORT TERM GOAL #1   Title pt will be I with basic HEP 12/29/2014   Time 4   Period  Weeks   Status Achieved   PT SHORT TERM GOAL #2   Title pt will increase R shoulder flexion and abduction by > 15 degrees to assist with functional progression 12/29/2014   Time 4   Period Weeks   Status On-going   PT SHORT TERM GOAL #3   Title pt will decrease R shoulder pain to <6/10 to help with exercise progression and ADLs 12/29/2014   Time 4   Period Weeks   Status On-going   PT SHORT TERM GOAL #4   Title she will increase R shoulder strength to 4-/5 to help with ADLs 12/29/2014   Time 4   Period Weeks   Status On-going   PT SHORT TERM GOAL #5   Title she will be able to verbalize and demonstrate techniques to reduce inflammation via RICE method 12/29/2014   Time 4   Period Weeks   Status New           PT Long Term Goals - 12/18/14 1308    PT LONG TERM GOAL #1   Title pt will be I with advanced HEP (01/23/15)   Time 8   Period Weeks   Status On-going   PT LONG TERM GOAL #2   Title pt will increase R overall Shoulder AROM to [facility] compared bil to assist with personal hygiene and ADLs (01/23/15)   Time 8   Period Weeks   Status On-going   PT LONG TERM GOAL #3   Title pt will decrase pain to <4/10 during and following overhead lifting of >5# to assist wtih ADLs (01/23/15)   Time 8   Period Weeks   Status On-going   PT LONG TERM GOAL #4   Title pt will increase R shoulder strength to >4+/5 to help with household cleaning, and lifting and carrying activities (01/23/15)   Time 8   Period Weeks   Status On-going   PT LONG TERM GOAL #5   Title pt will increase FOTO score to >60% to assist with functional capacity (01/23/15)   Time 8   Period Weeks   Status On-going               Plan - 12/18/14 1305    Clinical Impression Statement Jozy continues to make progress with decreased pain and increased AROM with Flexion and ABD assessed in supine with flexion at 155 degrees and abd at 131 with tightness and pain at end range. She tolerated exercises well  and plan to  progress with strengthening and shoulder mobility.    PT Next Visit Plan , modalities PRN, Shoulder AAROM/PROM, and strengthening, manual   PT Home Exercise Plan scaption exercises, and bicep stretch   Consulted and  Agree with Plan of Care Patient        Problem List There are no active problems to display for this patient.  Starr Lake PT, DPT, LAT, ATC  12/18/2014  1:10 PM    [facility] [address] [place], [name], [address] Phone: [phone]   Fax:  [phone]

## 2014-12-20 ENCOUNTER — Ambulatory Visit: Payer: Medicare Other | Attending: Orthopedic Surgery | Admitting: Physical Therapy

## 2014-12-20 DIAGNOSIS — M25511 Pain in right shoulder: Secondary | ICD-10-CM | POA: Insufficient documentation

## 2014-12-20 DIAGNOSIS — R293 Abnormal posture: Secondary | ICD-10-CM | POA: Insufficient documentation

## 2014-12-20 DIAGNOSIS — M25621 Stiffness of right elbow, not elsewhere classified: Secondary | ICD-10-CM | POA: Diagnosis present

## 2014-12-20 DIAGNOSIS — R29898 Other symptoms and signs involving the musculoskeletal system: Secondary | ICD-10-CM | POA: Diagnosis present

## 2014-12-20 DIAGNOSIS — M25611 Stiffness of right shoulder, not elsewhere classified: Secondary | ICD-10-CM

## 2014-12-20 NOTE — Therapy (Signed)
Laurie Payne, [name], [address] Phone: [phone]   Fax:  [phone]  Physical Therapy Treatment  Patient Details  Name: Laurie Payne MRN: [id number] Date of Birth: Nov 01, 1950 Referring Provider:  Meredith Pel, MD  Encounter Date: 12/20/2014      PT End of Session - 12/20/14 1124    Visit Number 7   Number of Visits 16   Date for PT Re-Evaluation 01/23/15   PT Start Time 1100   PT Stop Time 1200   PT Time Calculation (min) 60 min   Activity Tolerance Patient tolerated treatment well   Behavior During Therapy [facility] for tasks assessed/performed      Past Medical History  Diagnosis Date  . Hypertension   . Diabetes mellitus without complication   . Arthritis     Past Surgical History  Procedure Laterality Date  . Cataract extraction    . Total abdominal hysterectomy  1988    There were no vitals filed for this visit.  Visit Diagnosis:  Right shoulder pain  Weakness of right arm  Stiffness of right upper arm joint  Abnormal posture  Decreased ROM of right shoulder      Subjective Assessment - 12/20/14 1107    Subjective "I have been doing ok, and only feel it a little bit when I raise my arm above my head but have noticed that I am getting better.    Currently in Pain? Yes   Pain Score 1    Pain Location Shoulder   Pain Orientation Right   Pain Descriptors / Indicators Aching   Pain Type Chronic pain   Pain Onset More than a month ago   Pain Frequency Intermittent            [facility] PT Assessment - 12/20/14 1306    AROM   Right Shoulder Flexion 160 Degrees  assessed in supine   Right Shoulder ABduction 136 Degrees  assessed in supine                     [facility] Adult PT Treatment/Exercise - 12/20/14 0001    Shoulder Exercises: Supine   Protraction AROM;Strengthening;Right;15 reps   Horizontal ABduction AROM;Strengthening;Both;15 reps;Theraband   Theraband Level (Shoulder Horizontal ABduction) Level 3 (Green)   Shoulder Exercises: Standing   External Rotation AROM;Strengthening;Theraband;12 reps   Theraband Level (Shoulder External Rotation) Level 3 (Green)   Internal Rotation AROM;Strengthening;Right;Theraband;15 reps   Theraband Level (Shoulder Internal Rotation) Level 3 (Green)   Flexion AROM;Strengthening;Right;Weights;15 reps   Theraband Level (Shoulder Flexion) --   Shoulder Flexion Weight (lbs) 2#   Extension AROM;Strengthening;Both;Theraband;10 reps   Theraband Level (Shoulder Extension) Level 4 (Blue)   Row AROM;Strengthening;Both;Theraband;10 reps   Theraband Level (Shoulder Row) Level 4 (Blue)   Retraction AROM;Strengthening;Both;10 reps;Theraband   Theraband Level (Shoulder Retraction) Level 2 (Red)   Shoulder Exercises: ROM/Strengthening   UBE (Upper Arm Bike) L2 x  8 min  alt direct every 2 min   Other ROM/Strengthening Exercises UE Ranger 2 x 15 ea. with flexion / abd   Shoulder Exercises: Body Blade   Other Body Blade Exercises IR/ER 3 x 15 sec   Modalities   Modalities Moist Heat   Moist Heat Therapy   Number Minutes Moist Heat 10 Minutes   Electrical Stimulation   Electrical Stimulation Location R shoulder   Electrical Stimulation Action IFC   Electrical Stimulation Parameters 15 min, 100% scan, to tolerance   Manual Therapy  Manual Therapy Myofascial release   Manual therapy comments grade 2-3 scapular and Thrall mobilzation in all directions  performed during e-stim to assist with pain relief                PT Education - 12/20/14 1123    Education provided Yes   Education Details horizontal abduction   Person(s) Educated Patient   Methods Explanation   Comprehension Verbalized understanding          PT Short Term Goals - 12/18/14 1308    PT SHORT TERM GOAL #1   Title pt will be I with basic HEP 12/29/2014   Time 4   Period Weeks   Status Achieved   PT SHORT TERM GOAL #2   Title pt  will increase R shoulder flexion and abduction by > 15 degrees to assist with functional progression 12/29/2014   Time 4   Period Weeks   Status On-going   PT SHORT TERM GOAL #3   Title pt will decrease R shoulder pain to <6/10 to help with exercise progression and ADLs 12/29/2014   Time 4   Period Weeks   Status On-going   PT SHORT TERM GOAL #4   Title she will increase R shoulder strength to 4-/5 to help with ADLs 12/29/2014   Time 4   Period Weeks   Status On-going   PT SHORT TERM GOAL #5   Title she will be able to verbalize and demonstrate techniques to reduce inflammation via RICE method 12/29/2014   Time 4   Period Weeks   Status New           PT Long Term Goals - 12/18/14 1308    PT LONG TERM GOAL #1   Title pt will be I with advanced HEP (01/23/15)   Time 8   Period Weeks   Status On-going   PT LONG TERM GOAL #2   Title pt will increase R overall Shoulder AROM to [facility] compared bil to assist with personal hygiene and ADLs (01/23/15)   Time 8   Period Weeks   Status On-going   PT LONG TERM GOAL #3   Title pt will decrase pain to <4/10 during and following overhead lifting of >5# to assist wtih ADLs (01/23/15)   Time 8   Period Weeks   Status On-going   PT LONG TERM GOAL #4   Title pt will increase R shoulder strength to >4+/5 to help with household cleaning, and lifting and carrying activities (01/23/15)   Time 8   Period Weeks   Status On-going   PT LONG TERM GOAL #5   Title pt will increase FOTO score to >60% to assist with functional capacity (01/23/15)   Time 8   Period Weeks   Status On-going               Plan - 12/20/14 1124    Clinical Impression Statement Jazzy presents to therapy today with mild report of pain 1/10. She continues to progress with increased AROM of the R shoulder and strength and is able to tolerated all exercises well with only mild complaint during [facility] joint mobilizations which she reported were fleeting, plan to progress with  strengthenign as tolerated.    PT Next Visit Plan , modalities PRN, Shoulder AAROM/PROM, and strengthening, manual   Consulted and Agree with Plan of Care Patient        Problem List There are no active problems to display for this patient.  Laurie Payne  Ericka Pontiff, DPT, LAT, ATC  12/20/2014  1:09 PM    [facility] [address] [place], [name], [address] Phone: [phone]   Fax:  [phone]

## 2014-12-24 ENCOUNTER — Ambulatory Visit: Payer: Medicare Other | Admitting: Physical Therapy

## 2014-12-26 ENCOUNTER — Ambulatory Visit: Payer: Medicare Other | Admitting: Physical Therapy

## 2014-12-26 DIAGNOSIS — M25621 Stiffness of right elbow, not elsewhere classified: Secondary | ICD-10-CM

## 2014-12-26 DIAGNOSIS — R29898 Other symptoms and signs involving the musculoskeletal system: Secondary | ICD-10-CM

## 2014-12-26 DIAGNOSIS — M25511 Pain in right shoulder: Secondary | ICD-10-CM | POA: Diagnosis not present

## 2014-12-26 DIAGNOSIS — R293 Abnormal posture: Secondary | ICD-10-CM

## 2014-12-26 DIAGNOSIS — M25611 Stiffness of right shoulder, not elsewhere classified: Secondary | ICD-10-CM

## 2014-12-26 NOTE — Therapy (Signed)
Laurie Payne, [name], [address] Phone: [phone]   Fax:  [phone]  Physical Therapy Treatment  Patient Details  Name: Laurie Payne MRN: [id number] Date of Birth: [birth year] Referring Provider:  Meredith Pel, MD  Encounter Date: 12/26/2014      PT End of Session - 12/26/14 0817    Visit Number 8   Number of Visits 16   Date for PT Re-Evaluation 01/23/15   PT Start Time 0800   PT Stop Time 0845   PT Time Calculation (min) 45 min   Activity Tolerance Patient tolerated treatment well   Behavior During Therapy [facility] for tasks assessed/performed      Past Medical History  Diagnosis Date  . Hypertension   . Diabetes mellitus without complication   . Arthritis     Past Surgical History  Procedure Laterality Date  . Cataract extraction    . Total abdominal hysterectomy  1988    There were no vitals filed for this visit.  Visit Diagnosis:  Right shoulder pain  Weakness of right arm  Stiffness of right upper arm joint  Abnormal posture  Decreased ROM of right shoulder      Subjective Assessment - 12/26/14 0805    Subjective "I didn't hurt at all yesterday, or while sleeping"   Currently in Pain? No/denies   Pain Score 0-No pain            [facility] PT Assessment - 12/26/14 0001    AROM   Right Shoulder Flexion 134 Degrees  pain at end range, after tx 148   Right Shoulder ABduction 112 Degrees  tightness at end range, 128 after tx (shoulder hiking   Strength   Right Shoulder Flexion 4/5   Right Shoulder Extension 4/5   Right Shoulder ABduction 4/5   Right Shoulder Internal Rotation 4/5   Right Shoulder External Rotation 4-/5   Left Shoulder Extension 5/5   Left Shoulder ABduction 5/5   Left Shoulder Internal Rotation 5/5   Left Shoulder External Rotation 5/5   Right Hand Grip (lbs) 74   Left Hand Grip (lbs) 76                     [facility] Adult PT  Treatment/Exercise - 12/26/14 0806    Shoulder Exercises: Standing   External Rotation AROM;Strengthening;Theraband;12 reps   Theraband Level (Shoulder External Rotation) Level 3 (Green)   External Rotation Limitations VC to keep elbow bent during exercise   Internal Rotation AROM;Strengthening;Right;Theraband;15 reps  2 sets   Theraband Level (Shoulder Internal Rotation) Level 4 (Blue)   Flexion AROM;Strengthening;Right;Weights;15 reps   Shoulder Flexion Weight (lbs) 2#   Extension AROM;Strengthening;Both;Theraband;20 reps  2 sets   Theraband Level (Shoulder Extension) Level 4 (Blue)   Row AROM;Strengthening;Both;Theraband;20 reps  2 sets   Theraband Level (Shoulder Row) Level 4 (Blue)   Retraction AROM;Strengthening;Both;10 reps;Theraband   Theraband Level (Shoulder Retraction) Level 2 (Red)   Other Standing Exercises bicep curl 7 # 2 x 10   Other Standing Exercises push-ups from counter 2 x 10   Shoulder Exercises: ROM/Strengthening   UBE (Upper Arm Bike) L1.5 x 10 min  alt dir every 2 min   Shoulder Exercises: Stretch   Corner Stretch 1 rep;30 seconds   Other Shoulder Stretches bicep stretch 3 x 30 sec  against wall, 3 with arm in nuetral, ER/IR   Shoulder Exercises: Body Blade   Other Body Blade Exercises IR/ER 2 x  30 sec   Moist Heat Therapy   Number Minutes Moist Heat --   Moist Heat Location --   Acupuncturist Location --   Electrical Stimulation Action --   Electrical Stimulation Parameters --   Electrical Stimulation Goals --   Manual Therapy   Manual Therapy --   Manual therapy comments --   Myofascial Release --                  PT Short Term Goals - 12/26/14 0848    PT SHORT TERM GOAL #1   Title pt will be I with basic HEP 12/29/2014   Time 4   Period Weeks   Status Achieved   PT SHORT TERM GOAL #2   Title pt will increase R shoulder flexion and abduction by > 15 degrees to assist with functional progression  12/29/2014   Time 4   Period Weeks   Status Achieved   PT SHORT TERM GOAL #3   Title pt will decrease R shoulder pain to <6/10 to help with exercise progression and ADLs 12/29/2014   Time 4   Period Weeks   Status Achieved   PT SHORT TERM GOAL #4   Title she will increase R shoulder strength to 4-/5 to help with ADLs 12/29/2014   Time 4   Period Weeks   Status Achieved   PT SHORT TERM GOAL #5   Title she will be able to verbalize and demonstrate techniques to reduce inflammation via RICE method 12/29/2014   Time 4   Period Weeks   Status Achieved           PT Long Term Goals - 12/26/14 0849    PT LONG TERM GOAL #1   Title pt will be I with advanced HEP (01/23/15)   Time 8   Period Weeks   Status On-going   PT LONG TERM GOAL #2   Title pt will increase R overall Shoulder AROM to [facility] compared bil to assist with personal hygiene and ADLs (01/23/15)   Time 8   Period Weeks   Status On-going   PT LONG TERM GOAL #3   Title pt will decrase pain to <4/10 during and following overhead lifting of >5# to assist wtih ADLs (01/23/15)   Time 8   Period Weeks   Status On-going   PT LONG TERM GOAL #4   Title pt will increase R shoulder strength to >4+/5 to help with household cleaning, and lifting and carrying activities (01/23/15)   Time 8   Period Weeks   Status On-going   PT LONG TERM GOAL #5   Title pt will increase FOTO score to >60% to assist with functional capacity (01/23/15)   Time 8   Period Weeks   Status On-going               Plan - 12/26/14 0817    Clinical Impression Statement Peaches continues to make great progress with increased shoulder AROM with flexion and abduciton with mild pain noted at end ranges. She met all STG and has increased her strength in all planes but continues to exhibit increased weakness and discomfort during MMT of R external rotation. Plan to progress with strengthening and mobillization as tolerated.  she reported no pain following todays  treatment and did not require MHP or e-stim for relief.    PT Next Visit Plan , modalities PRN, Shoulder AAROM/PROM, and strengthening, manual   PT Home Exercise Plan lifting  and carrying activities without hiking of the shoulder   Consulted and Agree with Plan of Care Patient        Problem List There are no active problems to display for this patient.  Starr Lake PT, DPT, LAT, ATC  12/26/2014  8:53 AM    [facility] [address] [place], [name], [address] Phone: [phone]   Fax:  [phone]

## 2014-12-31 ENCOUNTER — Ambulatory Visit: Payer: Medicare Other | Admitting: Physical Therapy

## 2014-12-31 DIAGNOSIS — R29898 Other symptoms and signs involving the musculoskeletal system: Secondary | ICD-10-CM

## 2014-12-31 DIAGNOSIS — M25511 Pain in right shoulder: Secondary | ICD-10-CM

## 2014-12-31 DIAGNOSIS — R293 Abnormal posture: Secondary | ICD-10-CM

## 2014-12-31 DIAGNOSIS — M25611 Stiffness of right shoulder, not elsewhere classified: Secondary | ICD-10-CM

## 2014-12-31 DIAGNOSIS — M25621 Stiffness of right elbow, not elsewhere classified: Secondary | ICD-10-CM

## 2014-12-31 NOTE — Therapy (Signed)
[place], [name], [address] Phone: [phone]   Fax:  [phone]  Physical Therapy Treatment  Patient Details  Name: Laurie Payne MRN: [id number] Date of Birth: [birth year] Referring Provider:  Meredith Pel, MD  Encounter Date: 12/31/2014      PT End of Session - 12/31/14 0848    Visit Number 9   Number of Visits 16   Date for PT Re-Evaluation 01/23/15   PT Start Time 0800   PT Stop Time 0900   PT Time Calculation (min) 60 min   Activity Tolerance Patient tolerated treatment well   Behavior During Therapy [facility] for tasks assessed/performed      Past Medical History  Diagnosis Date  . Hypertension   . Diabetes mellitus without complication   . Arthritis     Past Surgical History  Procedure Laterality Date  . Cataract extraction    . Total abdominal hysterectomy  1988    There were no vitals filed for this visit.  Visit Diagnosis:  Right shoulder pain  Weakness of right arm  Stiffness of right upper arm joint  Abnormal posture  Decreased ROM of right shoulder      Subjective Assessment - 12/31/14 0809    Subjective "I don't hurt as bad or as often now, I only notice the pain when I am putting on my clothes and reaching out and reaching around."   Currently in Pain? Yes   Pain Score 0-No pain   Aggravating Factors  putting on clothes   Pain Relieving Factors movement and exercises                         [facility] Adult PT Treatment/Exercise - 12/31/14 0810    Shoulder Exercises: Standing   External Rotation AROM;Strengthening;Theraband;12 reps   Theraband Level (Shoulder External Rotation) Level 4 (Blue)   Internal Rotation AROM;Strengthening;Right;Theraband;15 reps   Theraband Level (Shoulder Internal Rotation) Level 4 (Blue)   Flexion AROM;Strengthening;Right;Weights;15 reps   Shoulder Flexion Weight (lbs) 3#   Extension  AROM;Strengthening;Both;Theraband;20 reps   Theraband Level (Shoulder Extension) Level 4 (Blue)   Row AROM;Strengthening;Both;Theraband;20 reps   Theraband Level (Shoulder Row) Level 4 (Blue)   Retraction AROM;Strengthening;Both;10 reps;Theraband   Theraband Level (Shoulder Retraction) Level 3 (Green)   Shoulder Elevation AROM;Strengthening;Standing  putting weights into cabinet   Other Standing Exercises bicep curl 7 # 2 x 12   Other Standing Exercises push-ups from counter 2 x 10   Shoulder Exercises: ROM/Strengthening   UBE (Upper Arm Bike) L 1.5 x 10 min  alt dir every min   Other ROM/Strengthening Exercises IR with towel 2 x 5   with 10 sec hold   Shoulder Exercises: Body Blade   Other Body Blade Exercises IR/ER 2 x 30 sec   Moist Heat Therapy   Number Minutes Moist Heat 10 Minutes   Moist Heat Location Shoulder  supine   Electrical Stimulation   Electrical Stimulation Location R shoulder   Electrical Stimulation Action IFC   Electrical Stimulation Parameters 10 min, 100% scan, to tolerance   Electrical Stimulation Goals Pain                PT Education - 12/31/14 0847    Education provided Yes   Education Details towel IR stretch, doorway stretch   Person(s) Educated Patient   Methods Explanation   Comprehension Verbalized understanding;Returned demonstration  PT Short Term Goals - 12/26/14 0848    PT SHORT TERM GOAL #1   Title pt will be I with basic HEP 12/29/2014   Time 4   Period Weeks   Status Achieved   PT SHORT TERM GOAL #2   Title pt will increase R shoulder flexion and abduction by > 15 degrees to assist with functional progression 12/29/2014   Time 4   Period Weeks   Status Achieved   PT SHORT TERM GOAL #3   Title pt will decrease R shoulder pain to <6/10 to help with exercise progression and ADLs 12/29/2014   Time 4   Period Weeks   Status Achieved   PT SHORT TERM GOAL #4   Title she will increase R shoulder strength to 4-/5 to  help with ADLs 12/29/2014   Time 4   Period Weeks   Status Achieved   PT SHORT TERM GOAL #5   Title she will be able to verbalize and demonstrate techniques to reduce inflammation via RICE method 12/29/2014   Time 4   Period Weeks   Status Achieved           PT Long Term Goals - 12/26/14 0849    PT LONG TERM GOAL #1   Title pt will be I with advanced HEP (01/23/15)   Time 8   Period Weeks   Status On-going   PT LONG TERM GOAL #2   Title pt will increase R overall Shoulder AROM to [facility] compared bil to assist with personal hygiene and ADLs (01/23/15)   Time 8   Period Weeks   Status On-going   PT LONG TERM GOAL #3   Title pt will decrase pain to <4/10 during and following overhead lifting of >5# to assist wtih ADLs (01/23/15)   Time 8   Period Weeks   Status On-going   PT LONG TERM GOAL #4   Title pt will increase R shoulder strength to >4+/5 to help with household cleaning, and lifting and carrying activities (01/23/15)   Time 8   Period Weeks   Status On-going   PT LONG TERM GOAL #5   Title pt will increase FOTO score to >60% to assist with functional capacity (01/23/15)   Time 8   Period Weeks   Status On-going               Plan - 12/31/14 0848    Clinical Impression Statement Laurie Payne presents to therapy today with report of mild pain and tenderness that only occurs when she gets dressed. She tolerated all exercises well today with only pain noted during doorway stretch and towel shoulder IR stretch which was given as HEP. Plan to progress with strengthening as tolerated.    PT Next Visit Plan , modalities PRN, Shoulder AAROM/PROM, and strengthening, manual, progress note, FOTO, G-codes   PT Home Exercise Plan shoulder IR stretch with towel, and corner stretch        Problem List There are no active problems to display for this patient.  Starr Lake PT, DPT, LAT, ATC  12/31/2014  9:27 AM   [facility] [address] [place], [name], [address] Phone: [phone]   Fax:  [phone]

## 2014-12-31 NOTE — Patient Instructions (Signed)
   Kynslei Art PT, DPT, LAT, ATC  [facility] Phone: [phone]     

## 2015-01-02 ENCOUNTER — Ambulatory Visit: Payer: Medicare Other | Admitting: Physical Therapy

## 2015-01-02 DIAGNOSIS — R293 Abnormal posture: Secondary | ICD-10-CM

## 2015-01-02 DIAGNOSIS — M25621 Stiffness of right elbow, not elsewhere classified: Secondary | ICD-10-CM

## 2015-01-02 DIAGNOSIS — R29898 Other symptoms and signs involving the musculoskeletal system: Secondary | ICD-10-CM

## 2015-01-02 DIAGNOSIS — M25511 Pain in right shoulder: Secondary | ICD-10-CM | POA: Diagnosis not present

## 2015-01-02 DIAGNOSIS — M25611 Stiffness of right shoulder, not elsewhere classified: Secondary | ICD-10-CM

## 2015-01-02 NOTE — Therapy (Signed)
[facility] [place], [name], [address] Phone: [phone]   Fax:  [phone]  Physical Therapy Treatment  Patient Details  Name: Laurie Payne MRN: [id number] Date of Birth: 1951-05-22 Referring Provider:  Jilda Panda, MD  Encounter Date: 01/02/2015      PT End of Session - 01/02/15 0927    Visit Number 10   Number of Visits 16   Date for PT Re-Evaluation 01/23/15   PT Start Time 0800   PT Stop Time 0855   PT Time Calculation (min) 55 min   Activity Tolerance Patient tolerated treatment well   Behavior During Therapy [facility] for tasks assessed/performed      Past Medical History  Diagnosis Date  . Hypertension   . Diabetes mellitus without complication   . Arthritis     Past Surgical History  Procedure Laterality Date  . Cataract extraction    . Total abdominal hysterectomy  1988    There were no vitals filed for this visit.  Visit Diagnosis:  Right shoulder pain  Weakness of right arm  Stiffness of right upper arm joint  Abnormal posture  Decreased ROM of right shoulder      Subjective Assessment - 01/02/15 0805    Subjective pt reports that she isn't as painfull and is able to reach in the back seat without as much pain.  "I can tell that I am able to do more without as much pain or problems.    Currently in Pain? Yes   Pain Score 0-No pain   Pain Location Shoulder   Pain Orientation Right   Pain Descriptors / Indicators Aching   Pain Type Chronic pain            [facility] PT Assessment - 01/02/15 0001    Observation/Other Assessments   Focus on Therapeutic Outcomes (FOTO)  47% limitation   AROM   Right Shoulder Flexion 136 Degrees  assessed in standing   Right Shoulder ABduction 120 Degrees  assessed in standing   Strength   Right Shoulder Flexion 4/5   Right Shoulder Extension 4+/5   Right Shoulder ABduction 4/5  pain noted during testing   Right Shoulder Internal  Rotation 4+/5   Right Shoulder External Rotation 4/5   Right Hand Grip (lbs) 74   Left Hand Grip (lbs) 76                     [facility] Adult PT Treatment/Exercise - 01/02/15 0001    Shoulder Exercises: Standing   External Rotation AROM;Strengthening;Theraband;12 reps   Theraband Level (Shoulder External Rotation) Level 4 (Blue)   Internal Rotation AROM;Strengthening;Right;Theraband;15 reps   Theraband Level (Shoulder Internal Rotation) Level 4 (Blue)   Flexion AROM;Strengthening;Right;Weights;15 reps   Shoulder Flexion Weight (lbs) 3#   Extension AROM;Strengthening;Both;Theraband;20 reps   Theraband Level (Shoulder Extension) Level 4 (Blue)   Row AROM;Strengthening;Both;Theraband;20 reps   Theraband Level (Shoulder Row) Level 4 (Blue)   Retraction AROM;Strengthening;Both;10 reps;Theraband   Theraband Level (Shoulder Retraction) Level 4 (Blue)   Shoulder Elevation AROM;Strengthening;Standing   Other Standing Exercises bicep curl 7 # 2 x 12   Other Standing Exercises push-ups from counter 2 x 10   Shoulder Exercises: ROM/Strengthening   UBE (Upper Arm Bike) L2 x 10 min  alt dir every 5 min   Other ROM/Strengthening Exercises IR with towel 2 x 5    Moist Heat Therapy   Number Minutes Moist Heat 10 Minutes   Moist Heat  Location Shoulder  in supine   Electrical Stimulation   Electrical Stimulation Location R shoulder   Electrical Stimulation Action IFC   Electrical Stimulation Parameters 10 min, 100% scan, to tolerance   Electrical Stimulation Goals Pain                PT Education - 01/02/15 [phone]    Education provided Yes   Education Details HEP review   Person(s) Educated Patient   Methods Explanation   Comprehension Verbalized understanding          PT Short Term Goals - 12/26/14 0848    PT SHORT TERM GOAL #1   Title pt will be I with basic HEP 12/29/2014   Time 4   Period Weeks   Status Achieved   PT SHORT TERM GOAL #2   Title pt will increase  R shoulder flexion and abduction by > 15 degrees to assist with functional progression 12/29/2014   Time 4   Period Weeks   Status Achieved   PT SHORT TERM GOAL #3   Title pt will decrease R shoulder pain to <6/10 to help with exercise progression and ADLs 12/29/2014   Time 4   Period Weeks   Status Achieved   PT SHORT TERM GOAL #4   Title she will increase R shoulder strength to 4-/5 to help with ADLs 12/29/2014   Time 4   Period Weeks   Status Achieved   PT SHORT TERM GOAL #5   Title she will be able to verbalize and demonstrate techniques to reduce inflammation via RICE method 12/29/2014   Time 4   Period Weeks   Status Achieved           PT Long Term Goals - 12/26/14 0849    PT LONG TERM GOAL #1   Title pt will be I with advanced HEP (01/23/15)   Time 8   Period Weeks   Status On-going   PT LONG TERM GOAL #2   Title pt will increase R overall Shoulder AROM to [facility] compared bil to assist with personal hygiene and ADLs (01/23/15)   Time 8   Period Weeks   Status On-going   PT LONG TERM GOAL #3   Title pt will decrase pain to <4/10 during and following overhead lifting of >5# to assist wtih ADLs (01/23/15)   Time 8   Period Weeks   Status On-going   PT LONG TERM GOAL #4   Title pt will increase R shoulder strength to >4+/5 to help with household cleaning, and lifting and carrying activities (01/23/15)   Time 8   Period Weeks   Status On-going   PT LONG TERM GOAL #5   Title pt will increase FOTO score to >60% to assist with functional capacity (01/23/15)   Time 8   Period Weeks   Status On-going               Plan - 01/02/15 0927    Clinical Impression Statement Kandee continues to make great progress with decreased pain during every day activties with reaching and lifting involving the R shoulder. she has improved her shoulder flexion and abduciton AROM and strength but conitnues to have pain during abduction. He reports the most discomfort during towel internal  rotation stretches but reports she has been doing it at home and it is getting easier.   Plan to progress as tolerated shoulder and scapular strengthening.    PT Next Visit Plan , modalities PRN, Shoulder AAROM/PROM,  and strengthening, manual   PT Home Exercise Plan HEP review   Consulted and Agree with Plan of Care Patient          G-Codes - 18-Jan-2015 0930    Functional Assessment Tool Used FOTO 47% limited   Functional Limitation Carrying, moving and handling objects   Carrying, Moving and Handling Objects Current Status (K4818) At least 40 percent but less than 60 percent impaired, limited or restricted   Carrying, Moving and Handling Objects Goal Status (H6314) At least 20 percent but less than 40 percent impaired, limited or restricted      Problem List There are no active problems to display for this patient.     Starr Lake PT, DPT, LAT, ATC  01-18-15  9:31 AM      [facility] [address] [place], [name], [address] Phone: [phone]   Fax:  [phone]        Physical Therapy Progress Note  Dates of Reporting Period: 11/28/2014 to Jan 18, 2015   Objective Reports of Subjective Statement: pt reports lifting and carrying more without as much pain or difficulty. "today I was able to reach in the back seat easier with only a little bit of discomfort.   Objective Measurements: see above flow sheet  Goal Update: She as met all STG and is on track to meet LTG  Plan: continue with shoulder and scapular strengthening as tolerated. Increase abduction and internal rotation.   Reason Skilled Services are Required: continued pain in the R shoulder with abduction and IR with limited AROM and strength in all planes compared bil.

## 2015-01-07 ENCOUNTER — Ambulatory Visit: Payer: Medicare Other | Admitting: Physical Therapy

## 2015-01-07 DIAGNOSIS — M25511 Pain in right shoulder: Secondary | ICD-10-CM | POA: Diagnosis not present

## 2015-01-07 DIAGNOSIS — R29898 Other symptoms and signs involving the musculoskeletal system: Secondary | ICD-10-CM

## 2015-01-07 DIAGNOSIS — R293 Abnormal posture: Secondary | ICD-10-CM

## 2015-01-07 DIAGNOSIS — M25611 Stiffness of right shoulder, not elsewhere classified: Secondary | ICD-10-CM

## 2015-01-07 DIAGNOSIS — M25621 Stiffness of right elbow, not elsewhere classified: Secondary | ICD-10-CM

## 2015-01-07 NOTE — Therapy (Signed)
[place], [name], [address] Phone: [phone]   Fax:  [phone]  Physical Therapy Treatment  Patient Details  Name: Laurie Payne MRN: [id number] Date of Birth: 26-Jun-1951 Referring Provider:  Jilda Panda, MD  Encounter Date: 01/07/2015      PT End of Session - 01/07/15 0907    Visit Number 11   Number of Visits 16   Date for PT Re-Evaluation 01/23/15   PT Start Time 0800   PT Stop Time 0900   PT Time Calculation (min) 60 min   Activity Tolerance Patient tolerated treatment well   Behavior During Therapy [facility] for tasks assessed/performed      Past Medical History  Diagnosis Date  . Hypertension   . Diabetes mellitus without complication   . Arthritis     Past Surgical History  Procedure Laterality Date  . Cataract extraction    . Total abdominal hysterectomy  1988    There were no vitals filed for this visit.  Visit Diagnosis:  No diagnosis found.      Subjective Assessment - 01/07/15 0811    Subjective "my shoulder feels good but I believe I have a little arthritis in there because a cool breeze will make it feel sore/achey"   Currently in Pain? Yes   Pain Score 1    Pain Location Shoulder   Pain Orientation Right   Pain Descriptors / Indicators Aching   Pain Type Chronic pain   Pain Onset More than a month ago   Pain Frequency Intermittent                         [facility] Adult PT Treatment/Exercise - 01/07/15 0813    Shoulder Exercises: Prone   Other Prone Exercises I's, T's, Y's x 10 ea  1#   Shoulder Exercises: Standing   External Rotation AROM;Strengthening;Theraband;15 reps   Theraband Level (Shoulder External Rotation) Level 4 (Blue)   Internal Rotation AROM;Strengthening;Right;Theraband;15 reps   Theraband Level (Shoulder Internal Rotation) Level 4 (Blue)   Flexion AROM;Strengthening;Right;Weights;15 reps   Shoulder Flexion Weight (lbs) 4#   Extension AROM;Strengthening;Both;Theraband;20 reps  with controlled eccentric contraction   Theraband Level (Shoulder Extension) Level 4 (Blue)   Row AROM;Strengthening;Both;Theraband;20 reps  with controlled eccentric contraction   Theraband Level (Shoulder Row) Level 4 (Blue)   Retraction AROM;Strengthening;Both;10 reps;Theraband   Theraband Level (Shoulder Retraction) Level 4 (Blue)  with controlled eccentric contraction   Shoulder Elevation AROM;Strengthening;Standing  putting weights onto shelf 3#, 4#, and 5#   Other Standing Exercises push-ups from counter 2 x 10   Shoulder Exercises: ROM/Strengthening   UBE (Upper Arm Bike) L2 x 10 min  alt dir every 5 min   Other ROM/Strengthening Exercises IR with UE ranger 2 x 10   Shoulder Exercises: Stretch   Corner Stretch 3 reps  using doorway in upper/middle/ lower pos.    Other Shoulder Stretches bicep stretch 3 x 30 sec  against wall with IR/ER and nuetral pos   Shoulder Exercises: Body Blade   Other Body Blade Exercises IR/ER 2 x 30 sec   Moist Heat Therapy   Number Minutes Moist Heat 10 Minutes   Moist Heat Location Shoulder  in supine   Electrical Stimulation   Electrical Stimulation Location R shoulder   Electrical Stimulation Action IFC   Electrical Stimulation Parameters 10 min,100% scan, to tolerance   Electrical Stimulation Goals Pain  PT Education - 01/07/15 0906    Education provided Yes   Education Details possibility of discharge next visit.    Person(s) Educated Patient   Methods Explanation   Comprehension Verbalized understanding          PT Short Term Goals - 12/26/14 0848    PT SHORT TERM GOAL #1   Title pt will be I with basic HEP 12/29/2014   Time 4   Period Weeks   Status Achieved   PT SHORT TERM GOAL #2   Title pt will increase R shoulder flexion and abduction by > 15 degrees to assist with functional progression 12/29/2014   Time 4   Period Weeks   Status Achieved    PT SHORT TERM GOAL #3   Title pt will decrease R shoulder pain to <6/10 to help with exercise progression and ADLs 12/29/2014   Time 4   Period Weeks   Status Achieved   PT SHORT TERM GOAL #4   Title she will increase R shoulder strength to 4-/5 to help with ADLs 12/29/2014   Time 4   Period Weeks   Status Achieved   PT SHORT TERM GOAL #5   Title she will be able to verbalize and demonstrate techniques to reduce inflammation via RICE method 12/29/2014   Time 4   Period Weeks   Status Achieved           PT Long Term Goals - 12/26/14 0849    PT LONG TERM GOAL #1   Title pt will be I with advanced HEP (01/23/15)   Time 8   Period Weeks   Status On-going   PT LONG TERM GOAL #2   Title pt will increase R overall Shoulder AROM to [facility] compared bil to assist with personal hygiene and ADLs (01/23/15)   Time 8   Period Weeks   Status On-going   PT LONG TERM GOAL #3   Title pt will decrase pain to <4/10 during and following overhead lifting of >5# to assist wtih ADLs (01/23/15)   Time 8   Period Weeks   Status On-going   PT LONG TERM GOAL #4   Title pt will increase R shoulder strength to >4+/5 to help with household cleaning, and lifting and carrying activities (01/23/15)   Time 8   Period Weeks   Status On-going   PT LONG TERM GOAL #5   Title pt will increase FOTO score to >60% to assist with functional capacity (01/23/15)   Time 8   Period Weeks   Status On-going               Plan - 01/07/15 0815    Clinical Impression Statement Aziah presents to therapy today with mild report of tenderness and soreness in the shoulder but with report that it may be from just feeling cold in the joint. She tolerated all exercises well with only complaint was fatigue during prone I's, T's, and Y's, and counter push-ups. pt discussed that she feels like she can do her therapy Independently and is debating if she wants to be discharged after he next visit. PT explained for her to bring in the  rest of her HEP for review.    PT Next Visit Plan , modalities PRN, Shoulder AAROM/PROM, and strengthening, manual, possible discharge on Wednesday, assess goals, FOTO, g-code   Consulted and Agree with Plan of Care Patient        Problem List There are no active problems to display  for this patient.  Starr Lake PT, DPT, LAT, ATC  01/07/2015  9:08 AM    [facility] [address] [place], [name], [address] Phone: [phone]   Fax:  [phone]

## 2015-01-09 ENCOUNTER — Ambulatory Visit: Payer: Medicare Other | Admitting: Physical Therapy

## 2015-01-09 DIAGNOSIS — M25511 Pain in right shoulder: Secondary | ICD-10-CM

## 2015-01-09 DIAGNOSIS — R29898 Other symptoms and signs involving the musculoskeletal system: Secondary | ICD-10-CM

## 2015-01-09 DIAGNOSIS — R293 Abnormal posture: Secondary | ICD-10-CM

## 2015-01-09 DIAGNOSIS — M25621 Stiffness of right elbow, not elsewhere classified: Secondary | ICD-10-CM

## 2015-01-09 NOTE — Patient Instructions (Signed)
   Shallen Luedke PT, DPT, LAT, ATC  [facility] Phone: [phone]     

## 2015-01-09 NOTE — Therapy (Signed)
[facility] [address] [place], [place], [address] Phone: [phone]   Fax:  [phone]  Physical Therapy Treatment  Patient Details  Name: Laurie Payne MRN: [id number] Date of Birth: 1950-11-25 Referring Provider:  Ralene Ok, MD  Encounter Date: 01/09/2015      PT End of Session - 01/09/15 0829    Visit Number 12   Number of Visits 16   Date for PT Re-Evaluation 01/23/15   PT Start Time 0800   PT Stop Time 0830   PT Time Calculation (min) 30 min   Activity Tolerance Patient tolerated treatment well   Behavior During Therapy [facility] for tasks assessed/performed      Past Medical History  Diagnosis Date  . Hypertension   . Diabetes mellitus without complication   . Arthritis     Past Surgical History  Procedure Laterality Date  . Cataract extraction    . Total abdominal hysterectomy  1988    There were no vitals filed for this visit.  Visit Diagnosis:  Right shoulder pain  Weakness of right arm  Stiffness of right upper arm joint  Abnormal posture      Subjective Assessment - 01/09/15 0808    Subjective "I have had no pain in my shoulder for the last couple of days"   Currently in Pain? No/denies   Pain Score 0-No pain   Pain Location Shoulder            [facility] PT Assessment - 01/09/15 0816    Observation/Other Assessments   Focus on Therapeutic Outcomes (FOTO)  16% limitation   AROM   Right Shoulder Flexion 140 Degrees   Right Shoulder ABduction 125 Degrees   Strength   Right Shoulder Flexion 5/5   Right Shoulder Extension 5/5   Right Shoulder ABduction 4+/5   Right Shoulder Internal Rotation 5/5   Right Shoulder External Rotation 4+/5   Right Hand Grip (lbs) 74   Left Hand Grip (lbs) 76                             PT Education - 01/09/15 0829    Education provided Yes   Education Details added exercises to HEP, and reviewed old exercises.    Person(s)  Educated Patient   Methods Explanation   Comprehension Verbalized understanding          PT Short Term Goals - 12/26/14 0848    PT SHORT TERM GOAL #1   Title pt will be I with basic HEP 12/29/2014   Time 4   Period Weeks   Status Achieved   PT SHORT TERM GOAL #2   Title pt will increase R shoulder flexion and abduction by > 15 degrees to assist with functional progression 12/29/2014   Time 4   Period Weeks   Status Achieved   PT SHORT TERM GOAL #3   Title pt will decrease R shoulder pain to <6/10 to help with exercise progression and ADLs 12/29/2014   Time 4   Period Weeks   Status Achieved   PT SHORT TERM GOAL #4   Title she will increase R shoulder strength to 4-/5 to help with ADLs 12/29/2014   Time 4   Period Weeks   Status Achieved   PT SHORT TERM GOAL #5   Title she will be able to verbalize and demonstrate techniques to reduce inflammation via RICE method 12/29/2014   Time 4  Period Weeks   Status Achieved           PT Long Term Goals - 23-Jan-2015 0818    PT LONG TERM GOAL #1   Title pt will be I with advanced HEP (01/23/15)   Time 8   Period Weeks   Status Achieved   PT LONG TERM GOAL #2   Title pt will increase R overall Shoulder AROM to [facility] compared bil to assist with personal hygiene and ADLs (01/23/15)   Time 8   Period Weeks   Status Achieved   PT LONG TERM GOAL #3   Title pt will decrase pain to <4/10 during and following overhead lifting of >5# to assist wtih ADLs (01/23/15)   Time 8   Period Weeks   Status Achieved   PT LONG TERM GOAL #4   Title pt will increase R shoulder strength to >4+/5 to help with household cleaning, and lifting and carrying activities (01/23/15)   Time 8   Period Weeks   Status On-going   PT LONG TERM GOAL #5   Title pt will increase FOTO score to >60% to assist with functional capacity (01/23/15)   Time 8   Period Weeks   Status Achieved               Plan - 01-23-2015 0829    Clinical Impression Statement [name] has  made great progress with improved shoulder AROM and strength and is additionally pain free. She has met all STG and LTG this visit. Reviewed HEP and added advanced exercises as well as green and blue therabands so she can progress the resistance at home. At this point she no longer reqires skilled physical therapy and will be discharge today.    PT Next Visit Plan discharged from PT   PT Home Exercise Plan added advanced HEP and reviewd previous HEP          G-Codes - 01-23-2015 0831    Functional Assessment Tool Used FOTO 16% limitation   Functional Limitation Carrying, moving and handling objects   Carrying, Moving and Handling Objects Goal Status (U6333) At least 20 percent but less than 40 percent impaired, limited or restricted   Carrying, Moving and Handling Objects Discharge Status [phone]) At least 1 percent but less than 20 percent impaired, limited or restricted      Problem List There are no active problems to display for this patient.                          PHYSICAL THERAPY DISCHARGE SUMMARY  Visits from Start of Care: 12  Current functional level related to goals / functional outcomes: FOTO 16% limitation   Remaining deficits: N/A   Education / Equipment: HEP handout and therabands for strengthening.  Plan: Patient agrees to discharge.  Patient goals were met. Patient is being discharged due to meeting the stated rehab goals.  ?????       Starr Lake PT, DPT, LAT, ATC  2015-01-23  8:35 AM      [facility] [address] [place], [name], [address] Phone: [phone]   Fax:  [phone]

## 2015-01-14 ENCOUNTER — Ambulatory Visit: Payer: Medicare Other | Admitting: Physical Therapy

## 2015-01-16 ENCOUNTER — Encounter: Payer: Medicare Other | Admitting: Physical Therapy

## 2015-01-22 ENCOUNTER — Ambulatory Visit: Payer: Medicare Other | Admitting: Physical Therapy

## 2015-01-24 ENCOUNTER — Encounter: Payer: Medicare Other | Admitting: Physical Therapy

## 2015-09-03 ENCOUNTER — Other Ambulatory Visit: Payer: Self-pay | Admitting: Internal Medicine

## 2017-04-09 IMAGING — MR MR SHOULDER*R* W/CM
6 series · 40 of 40 positions shown · IV contrast (agent unspecified)
Comparison: None.

CLINICAL DATA: Rotator cuff tear.  Labral tear.  Pain with motion.

EXAM:
MR ARTHROGRAM OF THE RIGHT SHOULDER
TECHNIQUE: Multiplanar, multisequence MR imaging of the RIGHT shoulder was
performed following the administration of intra-articular contrast.
CONTRAST:  See Injection Documentation.

[Series 4: T1 fat-sat · axial · 4.0mm · 0.27mm/px · z∈[+53,+118]mm · 6 of 18 slices shown (1 of 4)]
[im 1/18]
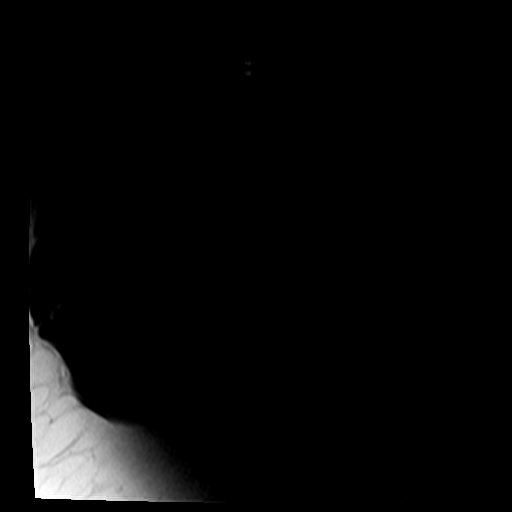
[im 4/18]
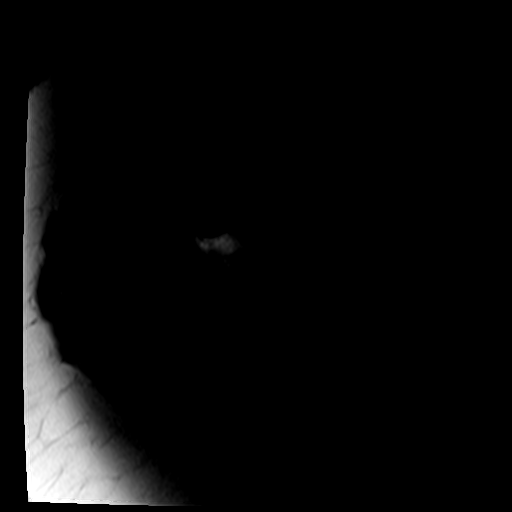
[im 7/18]
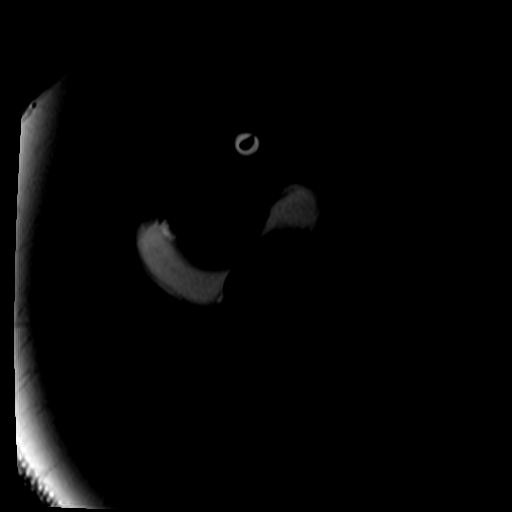
[im 11/18]
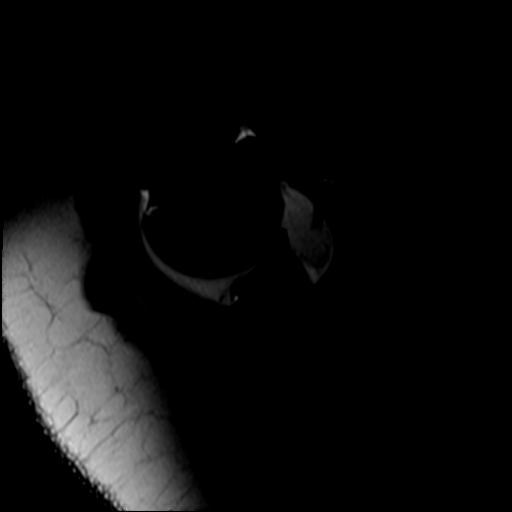
[im 14/18]
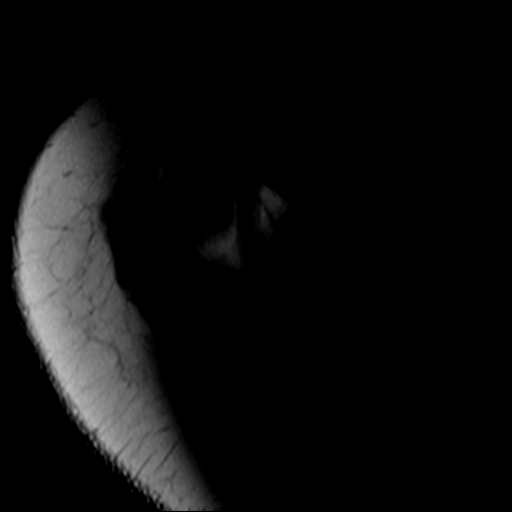
[im 18/18]
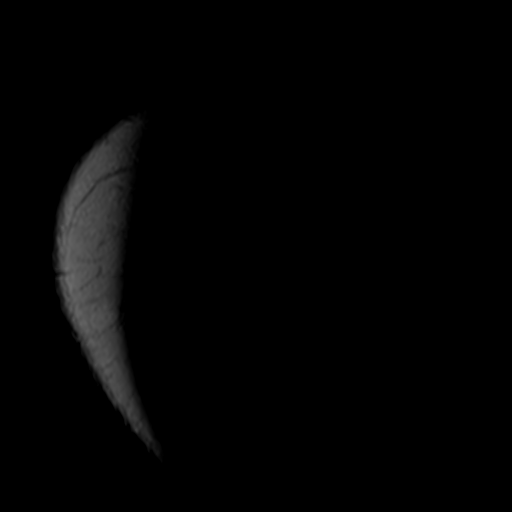

[Series 6: T1 fat-sat · oblique · 4.0mm · 0.62mm/px · 6 of 16 slices shown (2 of 4)]
[im 1/16]
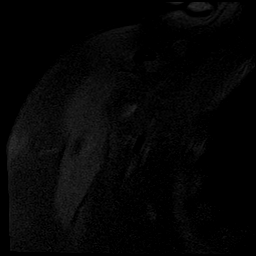
[im 4/16]
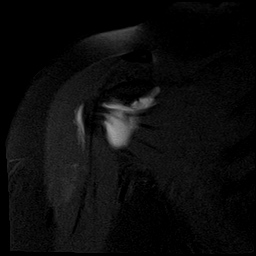
[im 7/16]
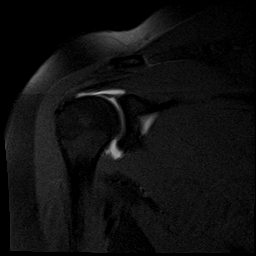
[im 10/16]
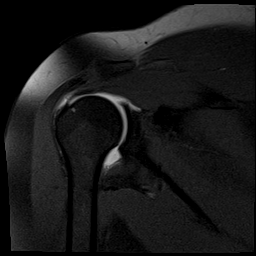
[im 13/16]
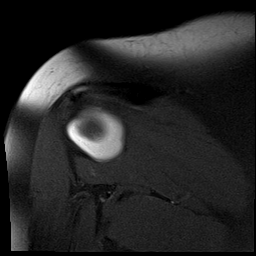
[im 16/16]
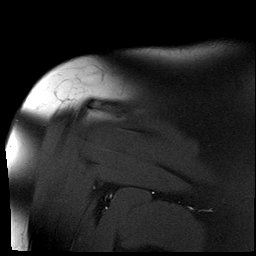

[Series 8: T1 fat-sat · oblique · 4.0mm · 0.62mm/px · 7 of 16 slices shown (3 of 4)]
[im 1/16]
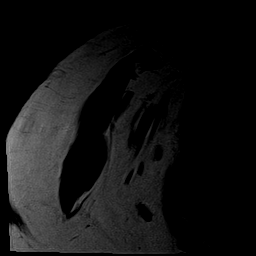
[im 3/16]
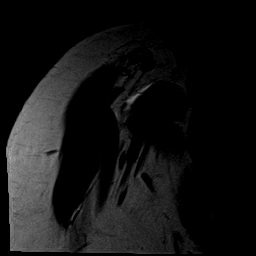
[im 6/16]
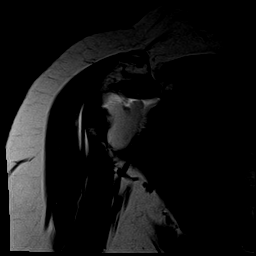
[im 8/16]
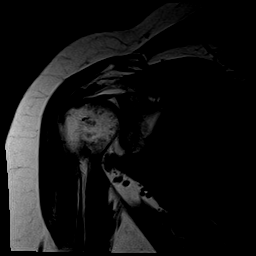
[im 11/16]
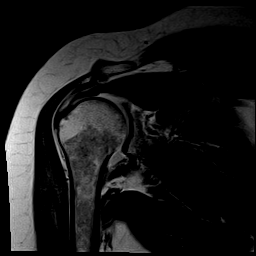
[im 13/16]
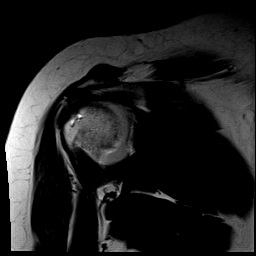
[im 16/16]
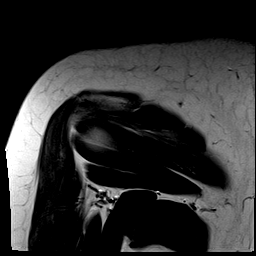

[Series 9: t2_sag_fs · coronal · 4.0mm · 0.62mm/px · 7 of 16 slices shown]
[im 1/16]
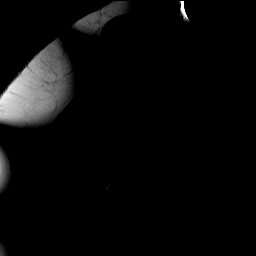
[im 3/16]
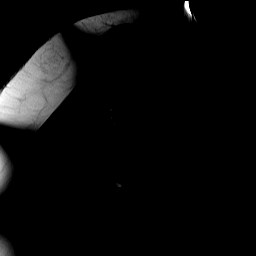
[im 6/16]
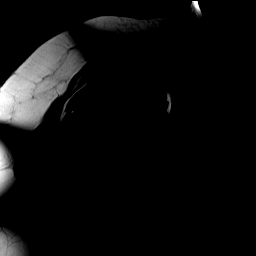
[im 8/16]
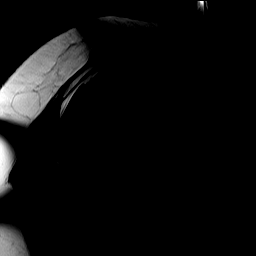
[im 11/16]
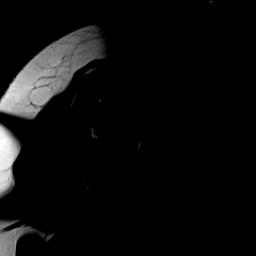
[im 13/16]
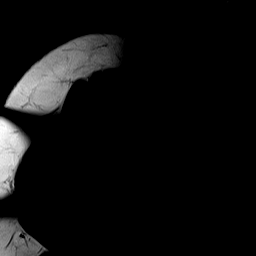
[im 16/16]
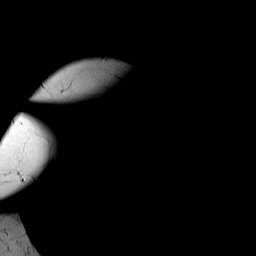

[Series 10: T2 fat-sat · oblique · 4.0mm · 0.62mm/px · 7 of 16 slices shown]
[im 1/16]
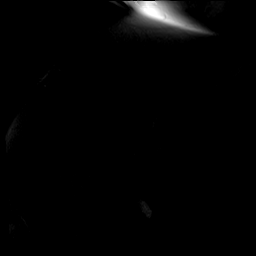
[im 3/16]
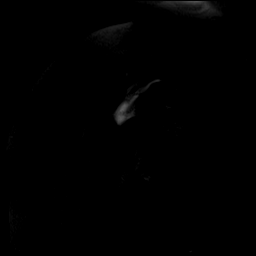
[im 6/16]
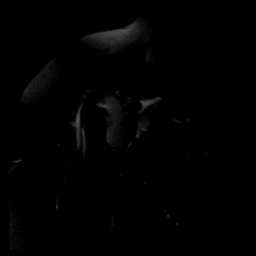
[im 8/16]
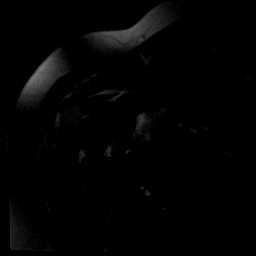
[im 11/16]
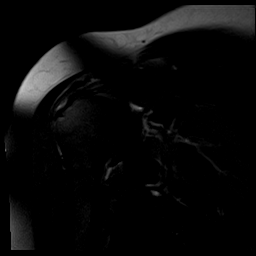
[im 13/16]
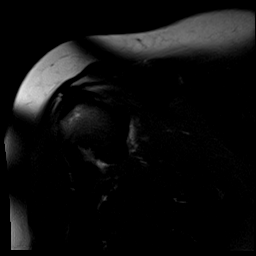
[im 16/16]
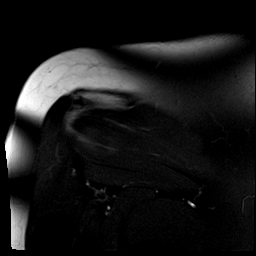

[Series 15: T1 fat-sat · sagittal · 4.0mm · 0.62mm/px · 7 of 16 slices shown (4 of 4)]
[im 1/16]
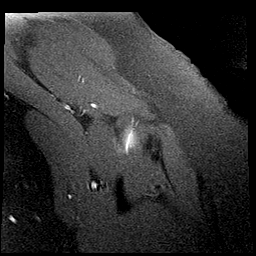
[im 3/16]
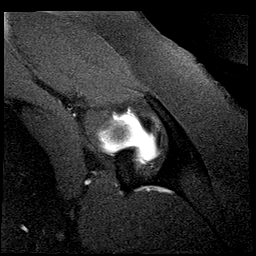
[im 6/16]
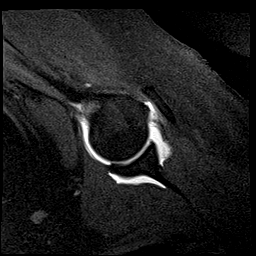
[im 8/16]
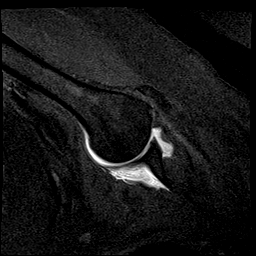
[im 11/16]
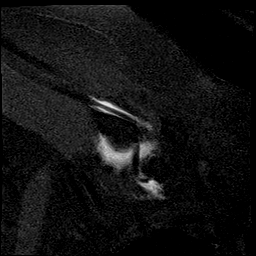
[im 13/16]
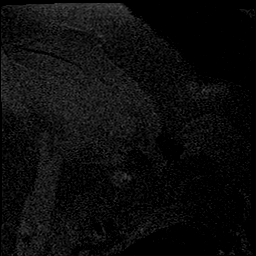
[im 16/16]
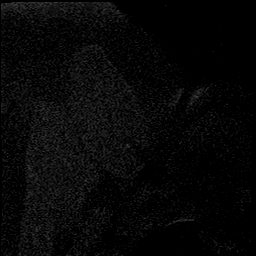

[40 of 40 positions shown; findings below may reference images not displayed]

FINDINGS: Rotator cuff: Extravasation of contrast from the glenohumeral joint
into the subacromial/ subdeltoid bursa is evident on the coronal
images. The perforation appears small and is at the posterior
insertion of SUPRASPINATUS (image 6 series 4). The INFRASPINATUS and
teres minor tendons appear intact. SUBSCAPULARIS tendon shows
attenuation but no tear.

Muscles: No atrophy or edema.

Biceps long head: Intact.

Acromioclavicular Joint: Mild AC joint osteoarthritis.

Glenohumeral Joint: No cartilage defects. Glenohumeral ligaments
appear normal.

Labrum: Normal.

Bones: Reactivation red marrow, likely obesity related.

Abduction external rotation view was performed however due to body
habitus and range of motion, this is of limited utility.
IMPRESSION: Small perforation at the posterior insertion of SUPRASPINATUS
accounting for extravasation of contrast into the subacromial/
subdeltoid bursa. Negative for labral tear.

## 2017-04-09 IMAGING — RF DG FLUORO GUIDE NDL PLC/BX
3 series · 3 of 3 positions shown · non-contrast
Comparison: none

CLINICAL DATA: Right shoulder pain.  Degenerative change.

[Series 1: (hospital) · 1 of 1 slices shown (1 of 3)]
[im 1/1]
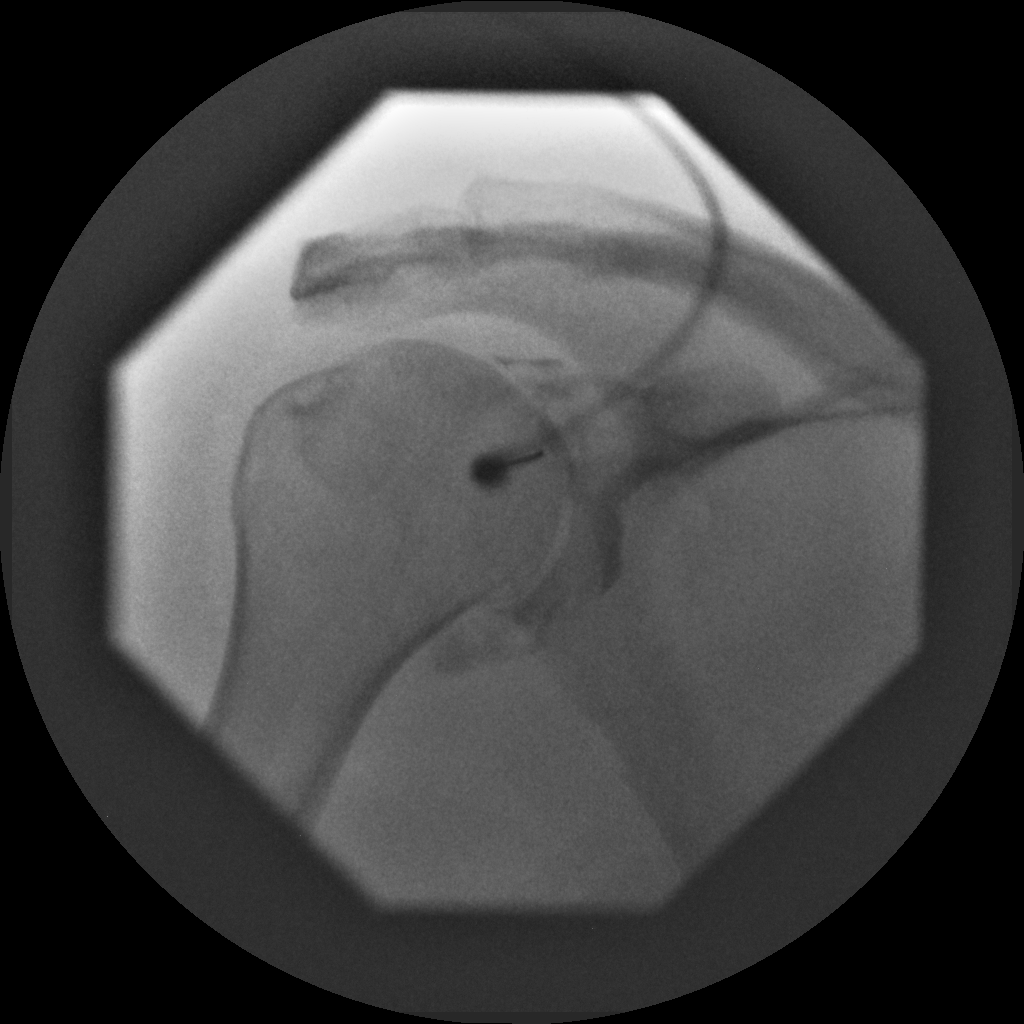

[Series 2: (hospital) · 1 of 1 slices shown (2 of 3)]
[im 1/1]
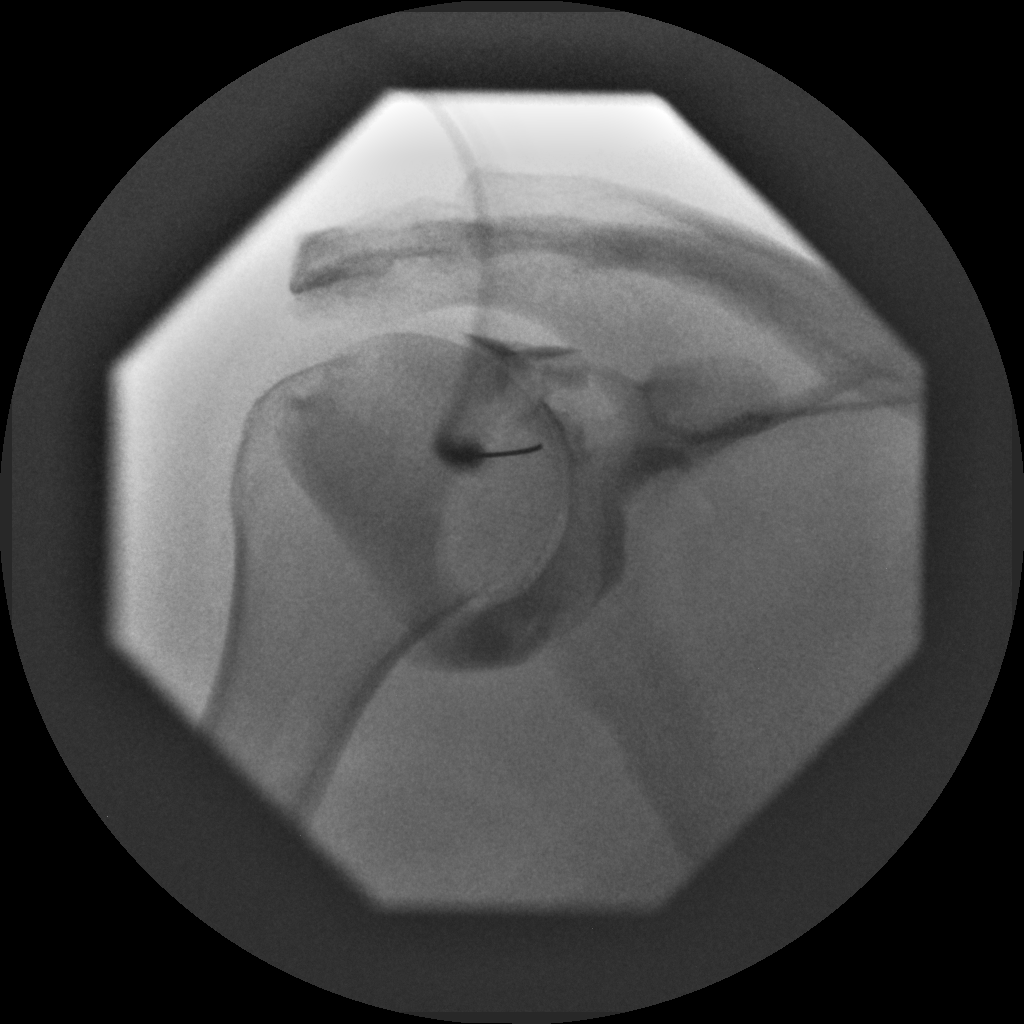

[Series 3: (hospital) · 1 of 1 slices shown (3 of 3)]
[im 1/1]
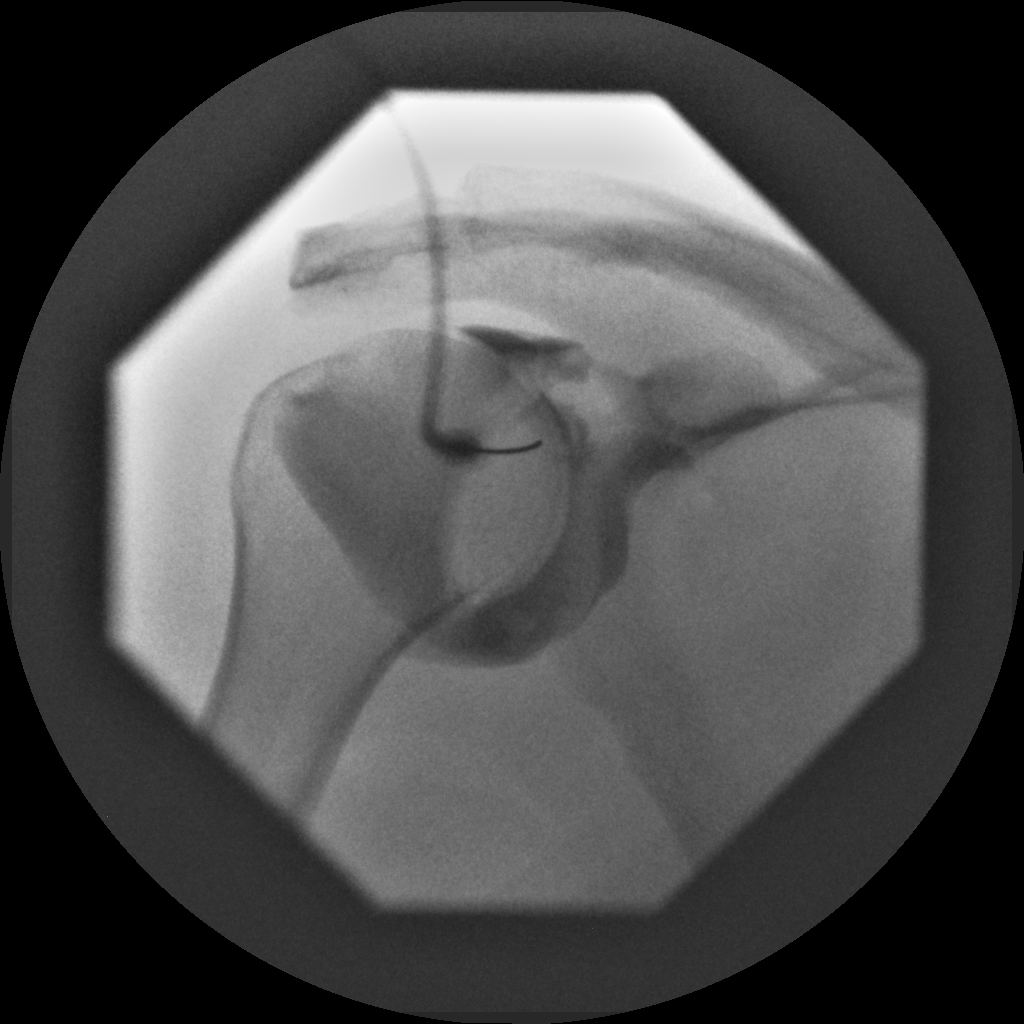

[3 of 3 positions shown; findings below may reference images not displayed]

FLUOROSCOPY TIME:  Fluoroscopy Time:  0 minutes 23 seconds

Number of Acquired Images:  0 acquired images

PROCEDURE:
Right SHOULDER INJECTION UNDER FLUOROSCOPY

An appropriate skin entrance site was determined. The site was
marked, prepped with Betadine, draped in the usual sterile fashion,
and infiltrated locally with buffered Lidocaine. 22 gauge spinal
needle was advanced to the superomedial margin of the humeral head
under intermittent fluoroscopy. 1 ml of Lidocaine injected easily. A
mixture of 0.1 ml Multihance and 20 ml of dilute Omnipaque 180 was
then used to opacify the right shoulder capsule. No immediate
complication.
IMPRESSION: Technically successful right shoulder injection for MRI.

## 2017-12-31 ENCOUNTER — Other Ambulatory Visit: Payer: Self-pay | Admitting: Internal Medicine

## 2018-10-21 DIAGNOSIS — I10 Essential (primary) hypertension: Secondary | ICD-10-CM | POA: Diagnosis not present

## 2018-10-21 DIAGNOSIS — E1165 Type 2 diabetes mellitus with hyperglycemia: Secondary | ICD-10-CM | POA: Diagnosis not present

## 2018-10-21 DIAGNOSIS — L853 Xerosis cutis: Secondary | ICD-10-CM | POA: Diagnosis not present

## 2018-10-21 DIAGNOSIS — E7800 Pure hypercholesterolemia, unspecified: Secondary | ICD-10-CM | POA: Diagnosis not present

## 2018-10-24 DIAGNOSIS — Z6841 Body Mass Index (BMI) 40.0 and over, adult: Secondary | ICD-10-CM | POA: Diagnosis not present

## 2018-10-24 DIAGNOSIS — I10 Essential (primary) hypertension: Secondary | ICD-10-CM | POA: Diagnosis not present

## 2018-10-24 DIAGNOSIS — K30 Functional dyspepsia: Secondary | ICD-10-CM | POA: Diagnosis not present

## 2018-10-24 DIAGNOSIS — E118 Type 2 diabetes mellitus with unspecified complications: Secondary | ICD-10-CM | POA: Diagnosis not present

## 2018-10-24 DIAGNOSIS — E7800 Pure hypercholesterolemia, unspecified: Secondary | ICD-10-CM | POA: Diagnosis not present

## 2018-11-08 DIAGNOSIS — E7800 Pure hypercholesterolemia, unspecified: Secondary | ICD-10-CM | POA: Diagnosis not present

## 2018-11-08 DIAGNOSIS — Z6841 Body Mass Index (BMI) 40.0 and over, adult: Secondary | ICD-10-CM | POA: Diagnosis not present

## 2018-11-08 DIAGNOSIS — I10 Essential (primary) hypertension: Secondary | ICD-10-CM | POA: Diagnosis not present

## 2018-11-08 DIAGNOSIS — E139 Other specified diabetes mellitus without complications: Secondary | ICD-10-CM | POA: Diagnosis not present

## 2018-11-08 DIAGNOSIS — E119 Type 2 diabetes mellitus without complications: Secondary | ICD-10-CM | POA: Diagnosis not present

## 2018-11-08 DIAGNOSIS — K30 Functional dyspepsia: Secondary | ICD-10-CM | POA: Diagnosis not present

## 2019-01-02 ENCOUNTER — Other Ambulatory Visit: Payer: Self-pay

## 2019-01-02 NOTE — Patient Outreach (Signed)
  [place] [facility] [facility]) Care Management Chronic Special Needs Program  01/02/2019  Name: MILAROSE SAVICH DOB: Jun 02, 1951  MRN: [id number]  Ms. Nayeliz Hipp is enrolled in a chronic special needs plan for Diabetes. Chronic Care Management Coordinator telephoned client to review health risk assessment and to develop individualized care plan.  Introduced the chronic care management program, importance of client participation, and taking their care plan to all provider appointments and inpatient facilities.    Subjective: client reports a history of DM, HTN. Client reports a change in primary care recently. She reports an improvement in her A1C results. Client states she is independent and denies any issues with ADL's. She reports she is in the "donut hole" and Trulicity cost $026/VZCHY and [place] cost $126/month. She is receptive to pharmacy referral. Client also reports she thinks she has had advanced directives completed in the past, but is not sure and request Advanced directive packet to be sent. Declines social work referral at this time.   Goals Addressed            This Visit's Progress   .  Acknowledge receipt of Programme researcher, broadcasting/film/video      . Client understands the importance of follow-up with providers by attending scheduled visits      . COMPLETED: Client will use Assistive Devices as needed and verbalize understanding of device use       Client denies any issue or problems with use of glucometer or blood pressure cuff.    . Client will verbalize knowledge of self management of Hypertension as evidences by BP reading of 140/90 or less; or as defined by provider       Verbalized blood pressure is currently less than 140/90    . HEMOGLOBIN A1C < 7.0       Diabetes self management actions: Glucose monitoring per provider recommendations Eat Healthy Check feet daily Visit provider every 3-6 months as directed Hbg A1C level every 3-6 months. Eye Exam yearly    .  Maintain timely refills of diabetic medication as prescribed within the year .      Marland Kitchen Obtain annual  Lipid Profile, LDL-C      . Obtain Annual Eye (retinal)  Exam       . Obtain Annual Foot Exam      . Obtain annual screen for micro albuminuria (urine) , nephropathy (kidney problems)      . Obtain Hemoglobin A1C at least 2 times per year       Completed April 2020    . Visit Primary Care Provider or Endocrinologist at least 2 times per year        Completed April 2020        Covid 19 precautions discussed. RNCM reinforced the 24 hour nurse advice line. RNCM encouraged client to contact health care concierge for benefit questions. Client encouraged to call RNCM as needed/Confirmed he has contact number.  Plan:  Send successful outreach letter with a copy of their individualized care plan, Send individual care plan to provider and Send educational material Chronic care management coordination will outreach in:  6 Months Will refer client to:  Pharmacy   Thea Silversmith, RN, MSN, [place] [phone]

## 2019-01-03 ENCOUNTER — Telehealth: Payer: Self-pay | Admitting: Pharmacist

## 2019-01-03 ENCOUNTER — Other Ambulatory Visit: Payer: Self-pay | Admitting: Pharmacy Technician

## 2019-01-03 NOTE — Patient Outreach (Signed)
[place] [facility]) Care Management  01/03/2019  Laurie Payne 22-Oct-1950 [id number]                           Medication Assistance Referral  Referral From: [place]  Medication/Company: [name] / [name] Patient application portion:  Education officer, museum portion: Faxed  to Dr. Janie Morning  Medication/Company: [name] / Ralph Leyden Patient application portion:  Mailed Provider application portion: Faxed  to Dr Janie Morning   Follow up:  Will follow up with patient in 5-10 business days to confirm application(s) have been received.  [name] [name], [place] Management [phone]

## 2019-01-03 NOTE — Patient Outreach (Signed)
[facility]) Care Management  [place]   01/03/2019  Laurie Payne [birth year] [id number]  Reason for referral: Medication Assistance, Medication Review  Referral source: [employer] C-SNP Care Manager with [facility] Current insurance: [employer] C-SNP  PMHx includes but not limited to:   Type 2 diabetes, hypertension, obesity,  Outreach:  Successful telephone call with patient.  HIPAA identifiers verified.   Subjective:  Patient reports Trulicity and [name] have copays >$175  Patient reports using a weekly pill box as an adherence strategy.  Does the patient ever forget to take medication?  no Does the patient have problems obtaining medications due to transportation?   no Does the patient have problems obtaining medications due to cost?  yes  Does the patient feel that medications prescribed are effective?  yes Does the patient ever experience any side effects to the medications prescribed?  no  Does the patient measure his/her own blood glucose at home?  Yes Does the patient measure his/her own blood pressure at home? No   Objective: The ASCVD Risk score Mikey Bussing DC Jr., et al., 2013) failed to calculate for the following reasons:   The systolic blood pressure is missing   Cannot find a previous HDL lab   Cannot find a previous total cholesterol lab  No results found for: CREATININE  No results found for: HGBA1C  Lipid Panel  No results found for: CHOL, TRIG, HDL, CHOLHDL, VLDL, LDLCALC, LDLDIRECT  BP Readings from Last 3 Encounters:  07/04/14 139/75    No Known Allergies  Medications Reviewed Today    Reviewed by Elayne Guerin, [facility] (Pharmacist) on 01/03/19 at 1317  Med List Status: <None>  Medication Order Taking? Sig Documenting Provider Last Dose Status Informant  amLODipine (NORVASC) 5 MG tablet [id number] Yes Take 5 mg by mouth daily. [provider] Taking Active Self        Discontinued 01/03/19 1314  (Completed Course)   Dulaglutide (TRULICITY) 1.5 RF/1.6BW SOPN [id number] Yes Inject 1.5 mg into the skin once a week. [provider] Taking Active   Fluocinolone Acetonide Body 0.01 % OIL [id number] Yes Apply to affected area twice daily as needed [provider] Taking Active         Discontinued 01/03/19 1315 (Completed Course)   insulin degludec (TRESIBA FLEXTOUCH) 100 UNIT/ML SOPN FlexTouch Pen [id number] Yes Inject 25 Units into the skin daily. [provider] Taking Active Self        Discontinued 01/03/19 1315 (Change in therapy)   losartan (COZAAR) 100 MG tablet [id number] Yes Take 100 mg by mouth daily. [provider] Taking Active Self        Discontinued 01/03/19 1316 (Change in therapy)         Discontinued 01/03/19 1317 (Completed Course)        Patient not taking:      Discontinued 01/03/19 1317 (Completed Course)   Vitamin D, Ergocalciferol, (DRISDOL) 50000 UNITS CAPS capsule [id number] Yes Take 50,000 Units by mouth every 7 (seven) days. [provider] Taking Active          HDL 58.000 M 10/21/2018 LDL-C 134.000 M 10/21/2018 Cholesterol, total 214.000 M 10/21/2018 Triglycerides 109.000 M 10/21/2018 A1C 8.900  Assessment: Drugs sorted by system:  Cardiovascular:  Amlodipine, Losartan  Endocrine: [name], Trulicity  Topical: Fluocinolone Acetonide  Vitamins/Minerals/Supplements: Ergocalciferol    Medication Review Findings:  . HgA1c- 8.9% . Patient with diabetes not on a statin   Medication Adherence Findings: Adherence  Review  '[x]'$  Excellent (no doses missed/week)     '[]'$  Good (no more than 1 dose missed/week)     '[]'$  Partial (2-3 doses missed/week) '[]'$  Poor (>3 doses missed/week)  Patient with excellent understanding of regimen and excellent understanding of indications.    Medication Assistance Findings:  Medication assistance needs identified: Trulicity and [place].  Extra Help:  May be eligible for Full Extra Help  Low Income Subsidy based on reported income and assets  Patient Assistance Programs: Trulicity made by [place] requirement met: Yes o Out-of-pocket prescription expenditure met:   Not Applicable - Patient has met application requirements to apply for this patient assistance program.     Georgina Quint made by [place] requirement met: Yes o Out-of-pocket prescription expenditure met:   Not Applicable - Patient has met application requirements to apply for this patient assistance program.     Plan: . Assisted patient with applying for Extra Help.  . I will route patient assistance letter to [place] technician who will coordinate patient assistance program application process for medications listed above.  [facility] pharmacy technician will assist with obtaining all required documents from both patient and provider(s) and submit application(s) once completed.  . Will route note to PCP.   Marland Kitchen Follow up with patient in 4-6 weeks.   Elayne Guerin, PharmD, [place] Clinical Pharmacist [phone]

## 2019-01-11 ENCOUNTER — Other Ambulatory Visit: Payer: Self-pay | Admitting: Pharmacy Technician

## 2019-01-11 NOTE — Patient Outreach (Signed)
[facility]) Care Management  01/11/2019  SHELBYLYNN WALCZYK 03/06/1951 [id number]  Successful outreach call placed to patient in regards to [place] application for Trulicity and [employer] for [employer].  Spoke to patient, HIPAA identifiers verified.  Patient informed she received the applications and mailed them back on Monday 01/09/2019.  Will followup with patient in 10-14 business days if application has not been received.  Adalai Perl P. Brinda Focht, [place] Management [phone]

## 2019-02-01 ENCOUNTER — Other Ambulatory Visit: Payer: Self-pay | Admitting: Pharmacy Technician

## 2019-02-01 NOTE — Patient Outreach (Signed)
[place] [facility]) Care Management  02/01/2019  Laurie Payne 07-21-1950 [id number]   ADDENDUM  Incoming call received from patient, HIPAA identifiers verified.  Patient was returning my call. Informed patient I have not received the applications that she mailed back on 01/09/2019. Informed patient I would be at the office on 02/02/2019 and if they had not arrived then I could mail them to her again. Patient was agreeable to this solution.  Plan to mail patient applications again if not received on 02/02/2019 and then will followup with patient in 5-10 business days to ensure she received the applications.  [name] P. Jonus Coble, [place] Management [phone]

## 2019-02-01 NOTE — Patient Outreach (Signed)
[name] [facility]) Care Management  02/01/2019  Laurie Payne 06/26/1951 [id number]   Unsuccessful outreach call placed to patient in regards to medication assistance applications for Tresiba through [name] and Musician through [employer].  Unfortunately patient did not answer the phone, HIPAA compliant voicemail left.  Was calling patient to inquire about the patient assistance applications that she mailed back on 01/09/2019 and to inform her that they have not been received back at [facility].  Will submit completed applications to the companies once they have been received.  Amana Bouska P. Darnelle Derrick, [place] Management [phone]

## 2019-02-02 ENCOUNTER — Other Ambulatory Visit: Payer: Self-pay | Admitting: Pharmacy Technician

## 2019-02-02 NOTE — Patient Outreach (Signed)
[place] [facility]) Care Management  02/02/2019  Laurie Payne [birth year] [id number]   Received all necessary documents and signatures from both patient and provider for [name] patient assistance for Trulicity and [name] patient assistance for [place].  Submitted compelted applications to both companies via fax.  Will followup with both companies in 3-10 business days.  Evellyn Tuff P. Copper Kirtley, [place] Management [phone]

## 2019-02-07 DIAGNOSIS — K30 Functional dyspepsia: Secondary | ICD-10-CM | POA: Diagnosis not present

## 2019-02-07 DIAGNOSIS — E139 Other specified diabetes mellitus without complications: Secondary | ICD-10-CM | POA: Diagnosis not present

## 2019-02-07 DIAGNOSIS — Z6841 Body Mass Index (BMI) 40.0 and over, adult: Secondary | ICD-10-CM | POA: Diagnosis not present

## 2019-02-07 DIAGNOSIS — E7800 Pure hypercholesterolemia, unspecified: Secondary | ICD-10-CM | POA: Diagnosis not present

## 2019-02-07 DIAGNOSIS — Z794 Long term (current) use of insulin: Secondary | ICD-10-CM | POA: Diagnosis not present

## 2019-02-07 DIAGNOSIS — I10 Essential (primary) hypertension: Secondary | ICD-10-CM | POA: Diagnosis not present

## 2019-02-08 ENCOUNTER — Other Ambulatory Visit: Payer: Self-pay | Admitting: Pharmacy Technician

## 2019-02-08 NOTE — Patient Outreach (Signed)
[facility]) Care Management  02/08/2019  Laurie Payne 02-Oct-1950 [id number]  ADDENDUM  Unsuccessful outreach call placed to patient in regards to [name] application for Tresiba.  Unfortunately patient did not answer the phone, HIPAA compliant voicemail left.  Was calling patient to inquire if she has received her LIS final decisional letter from [facility].  Will followup with patient in 3-7 business days if call is not returned.  Lanora Reveron P. Sahana Boyland, [place] Management [phone]

## 2019-02-08 NOTE — Patient Outreach (Signed)
[place] [facility]) Care Management  02/08/2019  Laurie Payne 07/02/1951 [id number]  Incoming call received from patient, HIPAA identifiers verified.  Patient was returning my call in regards to [name] application for [place].  Inquired if patient had received letter from [facility] indicating whether she had been approved for denied for LIS/Extra Help. Patient informed she received the letter and had been denied. Informed patient that [name] is requiring a copy of that letter to finish processing the application. Informed patient I would mail her a return envelope to send a copy of that letter to me and patient was agreeable.  Will send patient return envelope and will followup with patient in 10-20 business days if not received back.  [name] P. Mackenzee Becvar, [place] Management [phone]

## 2019-02-08 NOTE — Patient Outreach (Signed)
[place] [facility]) Care Management  02/08/2019  CHERYLYNN LISZEWSKI [birth year] [id number]   Care coordination call placed to [place] in regards to patient's [place] application.  Spoke to [place] who informed based on the social security statement patient submitted as her proof of income, she may qualify for low income subsidy as she is below the 150% poverty level. [place] informed they do not accept any form of partial subsidy but that if patient is denied LIS then they would need a copy of the denial letter to finish processing the application.  Will followup with patient as patient was assisted with the extra help application on January 03, 2019 by [facility] Denyse Amass to inquire if she has received any correspondence from the [facility] in regards to that application.  Dilara Navarrete P. Jrue Jarriel, [place] Management [phone]

## 2019-02-13 ENCOUNTER — Other Ambulatory Visit: Payer: Self-pay | Admitting: Pharmacy Technician

## 2019-02-13 NOTE — Patient Outreach (Signed)
[facility]) Care Management  02/13/2019  DUSTIN BURRILL 1950/12/05 [id number]    Care coordination call placed to [place] in regards to patient's Trulicity application.  Spoke to [place] who informed patient had been APPROVED 02/07/2019-07/20/2019. She transferred me to [place] at [employer] who informed medication was ready to be shipped to patient's home. She informed she would reach out to the patient today. She also informed patient could call in to set up delivery.  Will outreach patient with this information.  Orlondo Holycross P. Lebaron Bautch, [place] Management [phone]

## 2019-02-13 NOTE — Patient Outreach (Signed)
[place]) Care Management  02/13/2019  SANAZ SCARLETT May 28, 1951 [id number]   ADDENDUM  Unsuccessful outreach call placed to patient in regards to [place] application for Trulicity.  Unfortunately patient did not answer the phone, HIPAA compliant voicemail left.  Was calling patient to update her on the [place] application.  Goebel Hellums P. Sal Spratley, [place] Management [phone]

## 2019-02-15 ENCOUNTER — Other Ambulatory Visit: Payer: Self-pay | Admitting: Pharmacy Technician

## 2019-02-15 NOTE — Patient Outreach (Signed)
[facility]) Care Management  02/15/2019  ACCALIA RIGDON 09-Dec-1950 [id number]    Second unsuccessful followup outreach call made to patient in regards to [name] application for [place] and [place] manager for [employer].  Was calling to inquire if patient had received the envelope to mail back her LIS denial letter so that it can be submitted to [name]. Once [name] receives the denial letter then the process can continue to be evaluated.  Also was calling patient to update her on the [name] application for Trulicity and to provide her the phone number to call and set up delivery of the medication.  Will followup with patient in 3-7 business days if call is not returned.  Jabin Tapp P. Cheralyn Oliver, [place] Management [phone]

## 2019-02-16 ENCOUNTER — Other Ambulatory Visit: Payer: Self-pay | Admitting: Pharmacy Technician

## 2019-02-16 NOTE — Patient Outreach (Signed)
[place] [facility]) Care Management  02/16/2019  Laurie Payne 1950/12/27 [id number]   Incoming call received from patient in regards to voicemail left yesterday.  Spoke to patient, HIPAA identifiers verified.   Patient returning my call in regards to applications with [place] for [employer] and [name] for [employer].  Patient informed that the Trulicity was being delivered to her tomorrow as she had already spoken to them about the shipment.  Patient informed she had received the envelope to mail back the denial Extra Help letter. Patient informed she is in the process of relocating the letter as she has misplaced it. She informed she would place in the mail as soon as she could.  Will followup with patient in 10-14 business days if not received.  [name] P. Kallen Delatorre, [place] Management [phone]

## 2019-02-24 ENCOUNTER — Other Ambulatory Visit: Payer: Self-pay | Admitting: Pharmacy Technician

## 2019-02-24 NOTE — Patient Outreach (Signed)
[place] [facility]) Care Management  02/24/2019  Laurie Payne 1950/09/11 [id number]   Successful outreach call placed to patient in regards to [place] application for Trulicity and [employer] for [employer].  Spoke to patient, HIPAA identifiers verified.   Patient informed she mailed back the LIS letter late last week, she believes it was Friday. Informed patient I had not received it yet but that our mail takes a few extra days due to it going to the hospital before it is sorted out.  Patient also informed she received 3 boxes of Trulicity. Briefly discussed refill procedure with patient. Patient verbalized understanding.  Will followup with patient in 10-15 business days if letter has not been received.  Elissa Grieshop P. Stepehn Eckard, [place] Management [phone]

## 2019-03-02 ENCOUNTER — Ambulatory Visit: Payer: Self-pay | Admitting: Pharmacist

## 2019-03-07 ENCOUNTER — Other Ambulatory Visit: Payer: Self-pay | Admitting: Pharmacy Technician

## 2019-03-07 NOTE — Patient Outreach (Signed)
[name]) Care Management  03/07/2019  Laurie Payne March 06, 1951 [id number]    Received patient's pre decisional notice for LIS denial in the mail.  Submitted that information to [name] along with the patient's application for [place] to [name] via fax.  Will followup with Novo [employer]isk in 2-5 business days to inquire if they will accept the pre decisional notice as opposed to the final notice and to inquire if they have made a final determination on the patient's case.  Tawn Fitzner P. Alekai Pocock, [place] Management [phone]

## 2019-03-10 ENCOUNTER — Other Ambulatory Visit: Payer: Self-pay | Admitting: Pharmacy Technician

## 2019-03-10 NOTE — Patient Outreach (Signed)
[place] [facility]) Care Management  03/10/2019  Laurie Payne [birth year] [id number]  Care coordination call placed to [place] in regards to patient's application for Tresiba.  Spoke to [name] who informed patient had been APPROVED 03/09/2019-07/20/2019. She informed an order for a 4 months supply was placed on 03/09/2019. She informed it would arrive at the provider's office in 10-14 business days or 2-3 weeks.  Will followup with patient concerning this information.  Kris Burd P. [name], [place] Management [phone]

## 2019-03-10 NOTE — Patient Outreach (Signed)
[name]) Care Management  03/10/2019  Laurie Payne May 18, 1951 [id number]    Successful outreach call placed to patient in regards to [name] application for Tresiba.  Spoke to patient, HIPAA identifiers verified.  Informed patient she had been approved for the program and she could expect her shipment to arrive in 2-3 weeks. Patient verbalized understanding.  Will followup with patient in 10-20 business days to confirm medication was received.  Suezette Lafave P. Keyuna Cuthrell, [place] Management [phone]

## 2019-03-29 ENCOUNTER — Other Ambulatory Visit: Payer: Self-pay | Admitting: Pharmacy Technician

## 2019-03-29 NOTE — Patient Outreach (Signed)
[place] [facility]) Care Management  03/29/2019  Laurie Payne 1950-10-31 [id number]  Successful outreach call placed to patient in regards to [name] application for Tresiba.  Spoke to patient, HIPAA identifiers verified.  Patient informed she received 3 boxes of [place]. Discussed refill procedure with patient. Novo Nordisk requires the provider's to call in the refill request to them. Patient verbalized understanding.  Discussed refill procedure for Trulicity with [name]  with patient again and patient verbalized understanding.  Confirmed patient had name and number for future reference needs.  Will route note to [place] that patient assistance has been completed and will remove myself from care team.  Luiz Ochoa. Laurie Payne, [place] Management [phone]

## 2019-03-30 ENCOUNTER — Encounter: Payer: Self-pay | Admitting: Pharmacist

## 2019-04-13 ENCOUNTER — Other Ambulatory Visit: Payer: Self-pay | Admitting: Pharmacist

## 2019-04-13 ENCOUNTER — Ambulatory Visit: Payer: Self-pay | Admitting: Pharmacist

## 2019-04-13 NOTE — Patient Outreach (Signed)
[place] [facility]) Care Management  04/13/2019  AHSAKI FESPERMAN 1950-12-03 [id number]   Patient was called regarding medication assistance and for CSNP follow up. Unfortunately, she did not answer her phone. HIPAA compliant message was left on her voicemail.  Plan: Send unsuccessful contact letter. Call patient back in 4-6 weeks.  Elayne Guerin, PharmD, [place] Clinical Pharmacist [phone]

## 2019-05-04 DIAGNOSIS — E139 Other specified diabetes mellitus without complications: Secondary | ICD-10-CM | POA: Diagnosis not present

## 2019-05-09 DIAGNOSIS — E7800 Pure hypercholesterolemia, unspecified: Secondary | ICD-10-CM | POA: Diagnosis not present

## 2019-05-09 DIAGNOSIS — Z6841 Body Mass Index (BMI) 40.0 and over, adult: Secondary | ICD-10-CM | POA: Diagnosis not present

## 2019-05-09 DIAGNOSIS — E139 Other specified diabetes mellitus without complications: Secondary | ICD-10-CM | POA: Diagnosis not present

## 2019-05-09 DIAGNOSIS — Z794 Long term (current) use of insulin: Secondary | ICD-10-CM | POA: Diagnosis not present

## 2019-05-09 DIAGNOSIS — K30 Functional dyspepsia: Secondary | ICD-10-CM | POA: Diagnosis not present

## 2019-05-09 DIAGNOSIS — I10 Essential (primary) hypertension: Secondary | ICD-10-CM | POA: Diagnosis not present

## 2019-05-10 ENCOUNTER — Other Ambulatory Visit: Payer: Self-pay | Admitting: Pharmacist

## 2019-05-10 NOTE — Patient Outreach (Addendum)
[place] [facility]) Care Management  05/10/2019  ARINN MIHALOVICH 1950/12/04 [id number]  Called patient to follow up with her on medication assistance. HIPAA identifiers were obtained. Patient communicated understanding on how to reorder [place] and Trulicity.  She reported her HgA1c is down to 5.7% and that she does not have to go back to see her PCP for six months.  Plan: Call patient back in 3 months for CSNP follow up.  Elayne Guerin, PharmD, [place] Clinical Pharmacist [phone]

## 2019-05-31 DIAGNOSIS — Z1231 Encounter for screening mammogram for malignant neoplasm of breast: Secondary | ICD-10-CM | POA: Diagnosis not present

## 2019-06-06 ENCOUNTER — Encounter: Payer: Self-pay | Admitting: Gastroenterology

## 2019-06-27 ENCOUNTER — Other Ambulatory Visit: Payer: Self-pay

## 2019-06-27 NOTE — Patient Outreach (Signed)
  [facility] [facility]) Care Management Chronic Special Needs Program  06/27/2019  Name: Laurie Payne DOB: July 22, 1950  MRN: [id number]  Ms. [name] is enrolled in a chronic special needs plan for Diabetes. Reviewed and updated care plan.  Subjective: client reports she is doing well. She reports attending provider visits as scheduled and states she is able to get her medications without difficulty. Last A1C 5.7 in October/2020. Client reports she has not had her eye exam, but plans to schedule her exam next month. She denies any issues or concerns at this time..  Goals Addressed            This Visit's Progress   .  Acknowledge receipt of Advanced Directive package   On track    Reports has received, but request another copy.    . COMPLETED: Client understands the importance of follow-up with providers by attending scheduled visits       Voiced importance of attending provider visits.    . COMPLETED: Client will verbalize knowledge of self management of Hypertension as evidences by BP reading of 140/90 or less; or as defined by provider       Takes medications as prescribed; attends follow up visits; Verbalized blood pressure is currently less than 140/90    . COMPLETED: Maintain timely refills of diabetic medication as prescribed within the year .       Denies any issues obtaining medications.    . COMPLETED: Obtain annual  Lipid Profile, LDL-C       Done 02/02/2019    . Obtain Annual Eye (retinal)  Exam    On track   . COMPLETED: Obtain Annual Foot Exam       Client reports done this year.    . COMPLETED: Obtain annual screen for micro albuminuria (urine) , nephropathy (kidney problems)       Client reports done this year.    . COMPLETED: Obtain Hemoglobin A1C at least 2 times per year       Done April 2020 and October 2020    . COMPLETED: Visit Primary Care Provider or Endocrinologist at least 2 times per year    On track    Completed April 2020 and  October 2020      Covid 19 precautions discussed; encouraged client to call 24 hour nurse advice line as needed. RNCM reinforced the availability of the health care concierge for benefits questions. RNCM also encouraged client to call RNCM as needed-Confirmed client has the contact number.  Plan: RNCM will send updated care plan to client, send updated care plan to primary care. RNCM will outreach per tier level in 9-12 months or sooner as indicated.     Thea Silversmith, RN, MSN, [place] [phone]    .

## 2019-07-12 ENCOUNTER — Ambulatory Visit: Payer: Medicare Other | Attending: Internal Medicine

## 2019-07-12 DIAGNOSIS — Z20822 Contact with and (suspected) exposure to covid-19: Secondary | ICD-10-CM

## 2019-07-12 DIAGNOSIS — Z20828 Contact with and (suspected) exposure to other viral communicable diseases: Secondary | ICD-10-CM | POA: Diagnosis not present

## 2019-07-14 LAB — NOVEL CORONAVIRUS, NAA: SARS-CoV-2, NAA: NOT DETECTED

## 2019-07-27 DIAGNOSIS — H6992 Unspecified Eustachian tube disorder, left ear: Secondary | ICD-10-CM | POA: Diagnosis not present

## 2019-07-27 DIAGNOSIS — N951 Menopausal and female climacteric states: Secondary | ICD-10-CM | POA: Diagnosis not present

## 2019-08-10 DIAGNOSIS — Z7189 Other specified counseling: Secondary | ICD-10-CM | POA: Diagnosis not present

## 2019-08-10 DIAGNOSIS — N951 Menopausal and female climacteric states: Secondary | ICD-10-CM | POA: Diagnosis not present

## 2019-08-10 DIAGNOSIS — H6992 Unspecified Eustachian tube disorder, left ear: Secondary | ICD-10-CM | POA: Diagnosis not present

## 2019-08-15 ENCOUNTER — Other Ambulatory Visit: Payer: Self-pay | Admitting: Pharmacist

## 2019-08-15 ENCOUNTER — Ambulatory Visit: Payer: Self-pay | Admitting: Pharmacist

## 2019-08-15 DIAGNOSIS — J302 Other seasonal allergic rhinitis: Secondary | ICD-10-CM

## 2019-08-15 DIAGNOSIS — E1159 Type 2 diabetes mellitus with other circulatory complications: Secondary | ICD-10-CM

## 2019-08-15 DIAGNOSIS — E1169 Type 2 diabetes mellitus with other specified complication: Secondary | ICD-10-CM

## 2019-08-15 DIAGNOSIS — E1110 Type 2 diabetes mellitus with ketoacidosis without coma: Secondary | ICD-10-CM

## 2019-08-15 DIAGNOSIS — I152 Hypertension secondary to endocrine disorders: Secondary | ICD-10-CM

## 2019-08-15 NOTE — Patient Outreach (Addendum)
Laurie Payne) Care Management  08/15/2019  Laurie Payne 1951/05/19 [id number]   Patient was called regarding medication assistance and CSNP quarterly review. Unfortunately, she did not answer her phone. HIPAA compliant message was left on the mobile number. The number listed as her home phone was disconnected.  Plan: Send patient an unsuccessful outreach letter. Call patient back in 3 months.  Laurie Payne, PharmD, BCACP [facility] Clinical Pharmacist [phone]  Addendum:  Patient called me back.  Current insurance: HTA CSNP  HPI:  Patient is a 69 year old female with multiple medical conditions including but not limited to:  Type 2 diabetes, hyperlipidemia, hypertension, and seasonal allergies.   Objective: No Known Allergies  Medications Reviewed Today    Reviewed by Laurie Payne, [facility] (Pharmacist) on 08/15/19 at 1204  Med List Status: <None>  Medication Order Taking? Sig Documenting Provider Last Dose Status Informant  amLODipine (NORVASC) 5 MG tablet [id number] Yes Take 5 mg by mouth daily. [provider] Taking Active Self  azelastine (ASTELIN) 0.1 % nasal spray [id number] Yes Place 1 spray into both nostrils daily. [provider] Taking Active   cloNIDine (CATAPRES) 0.1 MG tablet [id number] Yes Take 0.1 mg by mouth at bedtime. [provider] Taking Active   Dulaglutide (TRULICITY) 1.5 0000000 SOPN [id number] Yes Inject 1.5 mg into the skin once a week. [provider] Taking Active   Fluocinolone Acetonide Body 0.01 % OIL [id number] Yes Apply to affected area twice daily as needed [provider] Taking Active   insulin degludec (TRESIBA FLEXTOUCH) 100 UNIT/ML SOPN FlexTouch Pen [id number] Yes Inject 20 Units into the skin daily.  [provider] Taking Active Self  losartan (COZAAR) 100 MG tablet [id number] Yes Take 100 mg by mouth daily. [provider] Taking Active Self           Assessment:  Medication Review Findings:  . HgA1c-5.7% down from 8.9% o Not on a statin- LDL-126, TG-63   Medication Assistance Findings:  Medication assistance needs identified: Trulicity and [name] Applications will be sent for 2021 Over-income for LIS/Extra Help   Additional medication assistance options reviewed with patient as warranted:  No other options identified  Plan: I will route patient assistance letter to [place] technician who will coordinate patient assistance program application process for medications listed above.  [facility] pharmacy technician will assist with obtaining all required documents from both patient and provider(s) and submit application(s) once completed.    Follow up in 6-8 weeks.  Laurie Payne, PharmD, [facility] Clinical Pharmacist [phone]

## 2019-08-16 DIAGNOSIS — E1159 Type 2 diabetes mellitus with other circulatory complications: Secondary | ICD-10-CM | POA: Insufficient documentation

## 2019-08-16 DIAGNOSIS — E1110 Type 2 diabetes mellitus with ketoacidosis without coma: Secondary | ICD-10-CM | POA: Insufficient documentation

## 2019-08-16 DIAGNOSIS — E1169 Type 2 diabetes mellitus with other specified complication: Secondary | ICD-10-CM | POA: Insufficient documentation

## 2019-08-16 DIAGNOSIS — J302 Other seasonal allergic rhinitis: Secondary | ICD-10-CM | POA: Insufficient documentation

## 2019-08-16 DIAGNOSIS — E785 Hyperlipidemia, unspecified: Secondary | ICD-10-CM | POA: Insufficient documentation

## 2019-08-17 ENCOUNTER — Other Ambulatory Visit: Payer: Self-pay | Admitting: Pharmacy Technician

## 2019-08-17 NOTE — Patient Outreach (Signed)
[place] [facility]) Care Management  08/17/2019  Laurie Payne 1950-10-25 [id number]                                      Medication Assistance Referral  Referral From: [place]  Medication/Company: [name] / [name] Patient application portion:  Education officer, museum portion: Faxed  to Dr. Janie Morning on 08/18/2019 Provider address/fax verified via: Office website  Medication/Company: [name] / [name] Patient application portion:  Mailed Provider application portion: Faxed  to Dr. Janie Morning on 08/18/2019 Provider address/fax verified via: Office website   Follow up:  Will follow up with patient in 5-15 business days to confirm application(s) have been received.  Hasten Sweitzer P. Rachal Dvorsky, [place] Management [phone]

## 2019-08-30 ENCOUNTER — Other Ambulatory Visit: Payer: Self-pay | Admitting: Pharmacy Technician

## 2019-08-30 NOTE — Patient Outreach (Signed)
[place] [facility]) Care Management  08/30/2019  Laurie Payne Nov 04, 1950 [id number]    Successful call placed to patient regarding patient assistance application(s) for [place] with [name] and Trulicity with [employer] , HIPAA identifiers verified.   Patient informed she received the application and placed them in the mail on 08/25/2019.  Follow up:  Will route note to [place] for case closure if document(s) have not been received in the next 15 business days.  Sheetal Lyall P. Lakhia Gengler, [place] Management [phone]

## 2019-08-31 ENCOUNTER — Other Ambulatory Visit: Payer: Self-pay | Admitting: Pharmacy Technician

## 2019-08-31 NOTE — Patient Outreach (Signed)
[place] [facility]) Care Management  08/31/2019  NICKEY CREASEY [birth year] [id number]   Received both patient  portion(s) of patient assistance application(s) for [place] and Trulicity. Faxed completed application and required documents into [name] and [employer].  Will follow up with company(ies) in 5-10 business days to check status of application(s).  Daneen Volcy P. Yael Angerer, [place] Management [phone]

## 2019-09-08 ENCOUNTER — Other Ambulatory Visit: Payer: Self-pay | Admitting: Pharmacy Technician

## 2019-09-08 NOTE — Patient Outreach (Signed)
[place] [facility]) Care Management  09/08/2019  Laurie Payne 03/02/1951 [id number]  Care coordination call placed to [place] in regards to patient's [place] application.  Spoke to [place] who informed patient was APPROVED 09/04/2019-06/18/2020. She informed an order for 120 days supply has been processed and the order should arrive in the next 10-14 business days to the provider's office.  Will follow up with patient in 14-21 business days to confirm receipt of medication.  [name] P. Avonell Lenig, [place] Management [phone]

## 2019-09-13 ENCOUNTER — Other Ambulatory Visit: Payer: Self-pay | Admitting: Pharmacy Technician

## 2019-09-13 NOTE — Patient Outreach (Signed)
[facility] Payne [facility]) Care Management  09/13/2019  [name] March 24, 1951 [id number]  ADDENDUM  Successful outreach call placed to patient in regards to patient's application for [place] with [name] and Trulicity with [employer].  Spoke to patient, HIPAA identifiers verified.  Informed patient she was approved with [name] and that the [place] would be delivered to the provider's office in the next 10-14 business days.  Also informed patient she was approved with [employer] and  that they or [place] would be outreaching her to set up her delivery by the end of the week. Provided patient the phone number to [place] and requested she call them to set up her delivery if she has not heard form them by weeks end. Patient verbalized understanding.  Will follow up with patient in 7-10 business days to confirm receipt of both medications.  Dorion Petillo P. Roselle Norton, [place] Management [phone]

## 2019-09-13 NOTE — Patient Outreach (Signed)
Laurie Payne) Care Management  09/13/2019  Laurie Payne [birth year] [id number]   Care coordination call placed to [place] in regards to patient's application for Trulicity.  Spoke to [place] who informed patient was APPROVED 09/13/2019-07/19/2020. Dee informed that patient should be receiving a call to set up her delivery.  Will outreach patient with this information.  Deitrick Ferreri P. Traeger Sultana, [place] Management [phone]

## 2019-09-26 ENCOUNTER — Other Ambulatory Visit: Payer: Self-pay | Admitting: Pharmacy Technician

## 2019-09-26 NOTE — Patient Outreach (Signed)
[place] [facility]) Care Management  09/26/2019  Laurie Payne [birth year] [id number]  ADDENDUM  Incoming call received from patient in regards to [place] application with [name].  Spoke to patient, HIPAA identifiers verified.  Patient informed she talked to the nurse at the provider's office and was informed that her 2 boxes of [name] was waiting for her to pick up. Patient informed she was going to pick up the medication today.  Discussed refill procedure with patient which entails the provider receiving a fax from [name], completing it and faxing it back to them. Informed patient when she has about 1 month supply left to go ahead and reach out to the provider as a reminder that she needs a refill sent in to [name] for [place]. Patient verbalized understanding. Previously discussed [name] refill procedure with patient today. Confirmed patient had name and number as she had no other questions or concerns.  Will route note to [place] that patient assistance has been completed and will remove myself from care team.  Luiz Ochoa. Render Marley, [place]  [phone]

## 2019-09-26 NOTE — Patient Outreach (Signed)
[facility]) Care Management  09/26/2019  SERAPHINE BOWENS 11/03/1950 [id number]    Successful call placed to patient regarding patient assistance medication delivery of Trulicity with [name] and [place] with [name], HIPAA identifiers verified.   Patient informed she received 4 boxes of the Trulicity. Discussed refill procedure with patient. Patient should automatically receive a call to schedule next delivery as she is on auto refill. [employer] patient if she only has 1 box remaining and has not set up delivery then she will need to call [name] to schedule a delivery. Patient confirmed having their phone number.  Patient informed she has not received the [place] and has not heard from the provider's office regarding delivery of the medication. Informed patient to outreach provider's office as per the automated system with [name], the medication arrived on 09/22/2019 at 9:33am and was signed for by [employer]. Patient was informed to call me back with what she found out.  Follow up:  Will follow up with patient in 1-3 business days if call is not returned.  Alvis Pulcini P. Brenda Cowher, [place]  [phone]

## 2019-09-28 ENCOUNTER — Telehealth: Payer: Self-pay | Admitting: Pharmacist

## 2019-09-28 NOTE — Patient Outreach (Addendum)
[place] [facility]) Care Management  09/28/2019  Laurie Payne [birth year] [id number]   [name] was received through [employer]. As such, patient's pharmacy case will be closed.   Patient communicated understanding of how to reorder her medications through [facility] with [facility].  Elayne Guerin, PharmD, BCACP [facility] Clinical Pharmacist [phone]  ADDENDUM Patient's case is being closed as the [place] Team has transitioned to the Quality Department and will no longer document in [facility].

## 2019-10-13 ENCOUNTER — Ambulatory Visit: Payer: Self-pay | Admitting: Pharmacist

## 2019-10-31 DIAGNOSIS — E139 Other specified diabetes mellitus without complications: Secondary | ICD-10-CM | POA: Diagnosis not present

## 2019-11-07 DIAGNOSIS — Z794 Long term (current) use of insulin: Secondary | ICD-10-CM | POA: Diagnosis not present

## 2019-11-07 DIAGNOSIS — E139 Other specified diabetes mellitus without complications: Secondary | ICD-10-CM | POA: Diagnosis not present

## 2019-11-07 DIAGNOSIS — E7800 Pure hypercholesterolemia, unspecified: Secondary | ICD-10-CM | POA: Diagnosis not present

## 2019-11-07 DIAGNOSIS — Z6839 Body mass index (BMI) 39.0-39.9, adult: Secondary | ICD-10-CM | POA: Diagnosis not present

## 2019-11-07 DIAGNOSIS — K30 Functional dyspepsia: Secondary | ICD-10-CM | POA: Diagnosis not present

## 2019-11-07 DIAGNOSIS — I10 Essential (primary) hypertension: Secondary | ICD-10-CM | POA: Diagnosis not present

## 2019-11-30 DIAGNOSIS — E139 Other specified diabetes mellitus without complications: Secondary | ICD-10-CM | POA: Diagnosis not present

## 2019-11-30 DIAGNOSIS — I10 Essential (primary) hypertension: Secondary | ICD-10-CM | POA: Diagnosis not present

## 2019-11-30 DIAGNOSIS — Z0000 Encounter for general adult medical examination without abnormal findings: Secondary | ICD-10-CM | POA: Diagnosis not present

## 2019-11-30 DIAGNOSIS — Z794 Long term (current) use of insulin: Secondary | ICD-10-CM | POA: Diagnosis not present

## 2019-11-30 DIAGNOSIS — M4696 Unspecified inflammatory spondylopathy, lumbar region: Secondary | ICD-10-CM | POA: Diagnosis not present

## 2020-02-06 DIAGNOSIS — Z794 Long term (current) use of insulin: Secondary | ICD-10-CM | POA: Diagnosis not present

## 2020-02-06 DIAGNOSIS — E559 Vitamin D deficiency, unspecified: Secondary | ICD-10-CM | POA: Diagnosis not present

## 2020-02-06 DIAGNOSIS — E139 Other specified diabetes mellitus without complications: Secondary | ICD-10-CM | POA: Diagnosis not present

## 2020-02-06 DIAGNOSIS — Z6839 Body mass index (BMI) 39.0-39.9, adult: Secondary | ICD-10-CM | POA: Diagnosis not present

## 2020-02-06 DIAGNOSIS — E7800 Pure hypercholesterolemia, unspecified: Secondary | ICD-10-CM | POA: Diagnosis not present

## 2020-02-06 DIAGNOSIS — I10 Essential (primary) hypertension: Secondary | ICD-10-CM | POA: Diagnosis not present

## 2020-02-06 DIAGNOSIS — K30 Functional dyspepsia: Secondary | ICD-10-CM | POA: Diagnosis not present

## 2020-02-22 ENCOUNTER — Other Ambulatory Visit: Payer: Self-pay

## 2020-02-22 NOTE — Patient Outreach (Addendum)
  [place] [facility]) Care Management Chronic Special Needs Program  02/22/2020  Name: Laurie Payne DOB: 1950/10/24  MRN: [id number]  Laurie Payne is enrolled in a chronic special needs plan for diabetes. Chronic Care Management Coordinator telephoned client to follow up. Health risk assessment reviewed and individualized care plan updated. RNCM reviewed the chronic care management program, importance of client participation, and taking their care plan to all provider appointments and inpatient facilities.    Subjective: Client reports she is doing well. She states she remains independent.; attends provider visits as scheduled and has all prescribed medications. She reports last A1C 6.1 on 02/06/20. She denies any issues or concerns at this time.   Goals Addressed              This Visit's Progress   .   Acknowledge receipt of Advanced Directive package   On track     Client request another copy. Advance directive packet mailed. Emmi education: "Advance directives" provided. Please review and call if you have any questions.    .  COMPLETED: Client will verbalize knowledge of self management of Hypertension as evidences by BP reading of 140/90 or less; or as defined by provider        Reports blood pressure less than 140/90. Denies any issues with blood pressure management. Emmi education provided: "Dash diet" Please review and call if you have any questions.     Marland Kitchen  HEMOGLOBIN A1C < 7 (pt-stated)        Per client: A1C 6.1 on 02/06/2020 and 7.0 on 11/07/2019  Diabetes self management actions: Glucose monitoring per provider recommendations Continue to Eat Healthy-low carbohydrate and low salt meal planning, watch portion sizes and avoid sugar sweetened drinks. Visit provider every 3-6 months as directed Hbg A1C level every 3-6 months. Continue to take medications as prescribed.     .  COMPLETED: Maintain timely refills of diabetic medication as prescribed within  the year .        Client denies any issues with obtaining medications.    .  COMPLETED: Obtain annual  Lipid Profile, LDL-C        Done 10/31/2019    .  COMPLETED: Obtain Annual Eye (retinal)  Exam         Client reports done May 2021    .  COMPLETED: Obtain Annual Foot Exam        Client reports done 2021    .  COMPLETED: Obtain annual screen for micro albuminuria (urine) , nephropathy (kidney problems)        Microalbumin/creat done 10/31/2019    .  COMPLETED: Obtain Hemoglobin A1C at least 2 times per year        Client reports A1C done 11/07/2019 and February 06, 2020    .  COMPLETED: Visit Primary Care Provider or Endocrinologist at least 2 times per year         Client reports endocrinologist seen twice 2021      Plan: send care plan to client, send care plan to primary care. Send Air traffic controller. Next outreach to client within the next 9-12 months or sooner as indicated.     Thea Silversmith, RN, MSN, [place] [phone]

## 2020-05-29 ENCOUNTER — Other Ambulatory Visit: Payer: Self-pay

## 2020-05-29 NOTE — Patient Outreach (Signed)
  [place] [facility]) Care Management Chronic Special Needs Program    05/29/2020  Name: Laurie Payne, DOB: November 08, 1950  MRN: [id number]   Ms. Zali Kamaka is enrolled in a chronic special needs plan for Diabetes. [place] Management will continue to provide services for this member through 07/19/2020. The [employer] Care Management Team will assume care 07/20/2020.   Thea Silversmith, RN, MSN, [place] [phone]

## 2020-06-05 DIAGNOSIS — Z1231 Encounter for screening mammogram for malignant neoplasm of breast: Secondary | ICD-10-CM | POA: Diagnosis not present

## 2020-06-05 DIAGNOSIS — M8588 Other specified disorders of bone density and structure, other site: Secondary | ICD-10-CM | POA: Diagnosis not present

## 2020-06-10 DIAGNOSIS — Z794 Long term (current) use of insulin: Secondary | ICD-10-CM | POA: Diagnosis not present

## 2020-06-10 DIAGNOSIS — Z23 Encounter for immunization: Secondary | ICD-10-CM | POA: Diagnosis not present

## 2020-06-10 DIAGNOSIS — E7800 Pure hypercholesterolemia, unspecified: Secondary | ICD-10-CM | POA: Diagnosis not present

## 2020-06-10 DIAGNOSIS — I10 Essential (primary) hypertension: Secondary | ICD-10-CM | POA: Diagnosis not present

## 2020-06-10 DIAGNOSIS — Z6839 Body mass index (BMI) 39.0-39.9, adult: Secondary | ICD-10-CM | POA: Diagnosis not present

## 2020-06-10 DIAGNOSIS — E139 Other specified diabetes mellitus without complications: Secondary | ICD-10-CM | POA: Diagnosis not present

## 2020-06-10 DIAGNOSIS — K30 Functional dyspepsia: Secondary | ICD-10-CM | POA: Diagnosis not present

## 2020-07-15 DIAGNOSIS — N898 Other specified noninflammatory disorders of vagina: Secondary | ICD-10-CM | POA: Diagnosis not present

## 2020-07-15 DIAGNOSIS — L853 Xerosis cutis: Secondary | ICD-10-CM | POA: Diagnosis not present

## 2020-07-26 ENCOUNTER — Other Ambulatory Visit: Payer: Self-pay

## 2020-09-16 DIAGNOSIS — R42 Dizziness and giddiness: Secondary | ICD-10-CM | POA: Diagnosis not present

## 2020-09-16 DIAGNOSIS — R0789 Other chest pain: Secondary | ICD-10-CM | POA: Diagnosis not present

## 2020-10-08 DIAGNOSIS — K30 Functional dyspepsia: Secondary | ICD-10-CM | POA: Diagnosis not present

## 2020-10-08 DIAGNOSIS — E7800 Pure hypercholesterolemia, unspecified: Secondary | ICD-10-CM | POA: Diagnosis not present

## 2020-10-08 DIAGNOSIS — Z794 Long term (current) use of insulin: Secondary | ICD-10-CM | POA: Diagnosis not present

## 2020-10-08 DIAGNOSIS — I10 Essential (primary) hypertension: Secondary | ICD-10-CM | POA: Diagnosis not present

## 2020-10-08 DIAGNOSIS — E139 Other specified diabetes mellitus without complications: Secondary | ICD-10-CM | POA: Diagnosis not present

## 2020-10-08 DIAGNOSIS — Z6839 Body mass index (BMI) 39.0-39.9, adult: Secondary | ICD-10-CM | POA: Diagnosis not present

## 2020-11-12 ENCOUNTER — Ambulatory Visit: Payer: HMO | Admitting: Podiatry

## 2020-11-12 ENCOUNTER — Encounter: Payer: Self-pay | Admitting: Podiatry

## 2020-11-12 ENCOUNTER — Other Ambulatory Visit: Payer: Self-pay

## 2020-11-12 DIAGNOSIS — E1110 Type 2 diabetes mellitus with ketoacidosis without coma: Secondary | ICD-10-CM | POA: Diagnosis not present

## 2020-11-12 DIAGNOSIS — M2142 Flat foot [pes planus] (acquired), left foot: Secondary | ICD-10-CM

## 2020-11-12 DIAGNOSIS — E1142 Type 2 diabetes mellitus with diabetic polyneuropathy: Secondary | ICD-10-CM | POA: Diagnosis not present

## 2020-11-12 DIAGNOSIS — M2141 Flat foot [pes planus] (acquired), right foot: Secondary | ICD-10-CM | POA: Diagnosis not present

## 2020-11-12 NOTE — Patient Instructions (Signed)
Talk to your primary care doctor about gabapentin or Lyrica if the pain gets worse

## 2020-11-12 NOTE — Progress Notes (Signed)
  Subjective:  Patient ID: Laurie Payne, female    DOB: 02-Jan-1951,  MRN: [id number]  Chief Complaint  Patient presents with  . Diabetes     np diabetic interested in ciabetic shoes if qualifies/pt aware may transititon to different provider in the future. Numbness and tingling in toes. A1C  7.1, blood sugar 135 today    70 y.o. female presents with the above complaint. History confirmed with patient.  She reports neuropathy with burning numbness and tingling in the toes and the pain is also in the heel sometimes  Objective:  Physical Exam: warm, good capillary refill, no trophic changes or ulcerative lesions, normal DP and PT pulses, normal monofilament exam and normal sensory exam.  Subjective paresthesias in toes.   Assessment:   1. Pes planus of both feet   2. DM (diabetes mellitus) type 2, uncontrolled, with ketoacidosis ([place])   3. Diabetic peripheral neuropathy ([place])      Plan:  Patient was evaluated and treated and all questions answered.  Patient educated on diabetes. Discussed proper diabetic foot care and discussed risks and complications of disease. Educated patient in depth on reasons to return to the office immediately should he/she discover anything concerning or new on the feet. All questions answered. Discussed proper shoes as well.   I think she would benefit from diabetic extra-depth shoes and multi density inserts.  She does have issues with neuropathy and pes planus deformity is at risk for ulceration.  Should be scheduled further fitting for these Return in about 1 year (around 11/12/2021) for diabetic foot examination .

## 2020-12-02 ENCOUNTER — Ambulatory Visit (INDEPENDENT_AMBULATORY_CARE_PROVIDER_SITE_OTHER): Payer: HMO | Admitting: *Deleted

## 2020-12-02 ENCOUNTER — Other Ambulatory Visit: Payer: Self-pay

## 2020-12-02 DIAGNOSIS — E1142 Type 2 diabetes mellitus with diabetic polyneuropathy: Secondary | ICD-10-CM

## 2020-12-02 DIAGNOSIS — M2142 Flat foot [pes planus] (acquired), left foot: Secondary | ICD-10-CM

## 2020-12-02 DIAGNOSIS — Z9109 Other allergy status, other than to drugs and biological substances: Secondary | ICD-10-CM | POA: Diagnosis not present

## 2020-12-02 DIAGNOSIS — Z0000 Encounter for general adult medical examination without abnormal findings: Secondary | ICD-10-CM | POA: Diagnosis not present

## 2020-12-02 DIAGNOSIS — M2141 Flat foot [pes planus] (acquired), right foot: Secondary | ICD-10-CM

## 2020-12-02 DIAGNOSIS — Z862 Personal history of diseases of the blood and blood-forming organs and certain disorders involving the immune mechanism: Secondary | ICD-10-CM | POA: Diagnosis not present

## 2020-12-02 DIAGNOSIS — Z789 Other specified health status: Secondary | ICD-10-CM | POA: Diagnosis not present

## 2020-12-02 DIAGNOSIS — R600 Localized edema: Secondary | ICD-10-CM | POA: Diagnosis not present

## 2020-12-02 DIAGNOSIS — E7800 Pure hypercholesterolemia, unspecified: Secondary | ICD-10-CM | POA: Diagnosis not present

## 2020-12-02 DIAGNOSIS — E139 Other specified diabetes mellitus without complications: Secondary | ICD-10-CM | POA: Diagnosis not present

## 2020-12-02 DIAGNOSIS — I10 Essential (primary) hypertension: Secondary | ICD-10-CM | POA: Diagnosis not present

## 2020-12-02 NOTE — Progress Notes (Signed)
Patient presents to the office today for diabetic shoe and insole measuring.  Patient was measured with brannock device to determine size and width for 1 pair of extra depth shoes and foam casted for 3 pair of insoles.   Documentation of medical necessity will be sent to patient's treating diabetic doctor to verify and sign.   Patient's diabetic provider: Dr. Madelin Rear  Shoes and insoles will be ordered at that time and patient will be notified for an appointment for fitting when they arrive.   Shoe size (per patient): 10.5   Brannock measurement: RIGHT - 9.5C, LEFT - 10C  Patient shoe selection-   1st choice:   Apex [id number]  2nd choice:  Apex [id number]  Shoe size ordered: Women's 10.5 Wide

## 2020-12-17 DIAGNOSIS — M7989 Other specified soft tissue disorders: Secondary | ICD-10-CM | POA: Diagnosis not present

## 2020-12-17 DIAGNOSIS — M79605 Pain in left leg: Secondary | ICD-10-CM | POA: Diagnosis not present

## 2020-12-17 DIAGNOSIS — I10 Essential (primary) hypertension: Secondary | ICD-10-CM | POA: Diagnosis not present

## 2020-12-20 ENCOUNTER — Other Ambulatory Visit: Payer: Self-pay | Admitting: Family Medicine

## 2020-12-20 DIAGNOSIS — M7989 Other specified soft tissue disorders: Secondary | ICD-10-CM

## 2020-12-24 ENCOUNTER — Ambulatory Visit
Admission: RE | Admit: 2020-12-24 | Discharge: 2020-12-24 | Disposition: A | Discharge status: Home / Self Care | Payer: HMO | Source: Ambulatory Visit | Attending: Family Medicine | Admitting: Family Medicine

## 2020-12-24 DIAGNOSIS — M79662 Pain in left lower leg: Secondary | ICD-10-CM | POA: Diagnosis not present

## 2020-12-24 DIAGNOSIS — R600 Localized edema: Secondary | ICD-10-CM | POA: Diagnosis not present

## 2020-12-24 DIAGNOSIS — M7989 Other specified soft tissue disorders: Secondary | ICD-10-CM

## 2020-12-25 ENCOUNTER — Other Ambulatory Visit: Payer: HMO

## 2021-02-19 ENCOUNTER — Other Ambulatory Visit: Payer: Self-pay

## 2021-02-19 ENCOUNTER — Ambulatory Visit (INDEPENDENT_AMBULATORY_CARE_PROVIDER_SITE_OTHER): Payer: HMO | Admitting: Podiatry

## 2021-02-19 DIAGNOSIS — M2141 Flat foot [pes planus] (acquired), right foot: Secondary | ICD-10-CM

## 2021-02-19 DIAGNOSIS — M2142 Flat foot [pes planus] (acquired), left foot: Secondary | ICD-10-CM

## 2021-02-19 DIAGNOSIS — E1142 Type 2 diabetes mellitus with diabetic polyneuropathy: Secondary | ICD-10-CM

## 2021-02-19 DIAGNOSIS — E1342 Other specified diabetes mellitus with diabetic polyneuropathy: Secondary | ICD-10-CM | POA: Diagnosis not present

## 2021-02-19 NOTE — Progress Notes (Signed)
The patient presented to the office today to pick up diabetic shoes and 3 pair diabetic custom inserts.  1 pair of inserts were put in the shoes and the shoes were fitted to the patient. The patient states they are comfortable and free of defect. She was satisfied with the fit of the shoe. Instructions for break in and wear were dispensed. The patient signed the delivery documentation and break in instruction form  If any concerns or questions arise, she is instructed to call

## 2021-02-27 ENCOUNTER — Ambulatory Visit (INDEPENDENT_AMBULATORY_CARE_PROVIDER_SITE_OTHER): Payer: HMO

## 2021-02-27 ENCOUNTER — Other Ambulatory Visit: Payer: Self-pay

## 2021-02-27 DIAGNOSIS — E1142 Type 2 diabetes mellitus with diabetic polyneuropathy: Secondary | ICD-10-CM

## 2021-02-27 NOTE — Progress Notes (Signed)
Patient in office today to return a pair of diabetic shoes that did not fit properly. Patient also did not like the style of the shoe. The shoe was too wide. The patient selected a different shoe and width at this time. Patient would like to order a women's 10.5 regular width X527W Apex Boss Runner shoe at this time. Patient kept custom inserts and was advised to bring shoe to next pick-up appointment. Patient verbalized understanding.

## 2021-03-28 ENCOUNTER — Telehealth: Payer: Self-pay | Admitting: Podiatry

## 2021-03-28 NOTE — Telephone Encounter (Signed)
Re ordered diabetic shoes in.. lvm for pt to call to schedule an appt to pick them up.. needs to bring inserts with her.Marland Kitchen

## 2021-04-10 ENCOUNTER — Ambulatory Visit (INDEPENDENT_AMBULATORY_CARE_PROVIDER_SITE_OTHER): Payer: HMO

## 2021-04-10 ENCOUNTER — Other Ambulatory Visit: Payer: Self-pay

## 2021-04-10 DIAGNOSIS — E1142 Type 2 diabetes mellitus with diabetic polyneuropathy: Secondary | ICD-10-CM

## 2021-04-10 NOTE — Progress Notes (Signed)
Patient in office today to pick-up diabetic shoes and custom inserts. Patient tried on the shoes with the custom inserts and was satisfied with the fit and feel of the shoe and inserts. Patient was educated on the break-in process and return policy at this time. Patient verbalized understanding using the teach back method. Advised patient to call the office with any questions, comments, or concerns.

## 2021-04-15 DIAGNOSIS — I10 Essential (primary) hypertension: Secondary | ICD-10-CM | POA: Diagnosis not present

## 2021-04-15 DIAGNOSIS — K30 Functional dyspepsia: Secondary | ICD-10-CM | POA: Diagnosis not present

## 2021-04-15 DIAGNOSIS — E139 Other specified diabetes mellitus without complications: Secondary | ICD-10-CM | POA: Diagnosis not present

## 2021-04-15 DIAGNOSIS — E7800 Pure hypercholesterolemia, unspecified: Secondary | ICD-10-CM | POA: Diagnosis not present

## 2021-04-15 DIAGNOSIS — Z794 Long term (current) use of insulin: Secondary | ICD-10-CM | POA: Diagnosis not present

## 2021-04-15 DIAGNOSIS — Z6839 Body mass index (BMI) 39.0-39.9, adult: Secondary | ICD-10-CM | POA: Diagnosis not present

## 2021-07-05 DIAGNOSIS — Z1231 Encounter for screening mammogram for malignant neoplasm of breast: Secondary | ICD-10-CM | POA: Diagnosis not present

## 2021-08-07 DIAGNOSIS — E139 Other specified diabetes mellitus without complications: Secondary | ICD-10-CM | POA: Diagnosis not present

## 2021-08-07 DIAGNOSIS — Z794 Long term (current) use of insulin: Secondary | ICD-10-CM | POA: Diagnosis not present

## 2021-08-08 DIAGNOSIS — K30 Functional dyspepsia: Secondary | ICD-10-CM | POA: Diagnosis not present

## 2021-08-08 DIAGNOSIS — E139 Other specified diabetes mellitus without complications: Secondary | ICD-10-CM | POA: Diagnosis not present

## 2021-08-08 DIAGNOSIS — E7800 Pure hypercholesterolemia, unspecified: Secondary | ICD-10-CM | POA: Diagnosis not present

## 2021-08-08 DIAGNOSIS — Z6839 Body mass index (BMI) 39.0-39.9, adult: Secondary | ICD-10-CM | POA: Diagnosis not present

## 2021-08-08 DIAGNOSIS — I10 Essential (primary) hypertension: Secondary | ICD-10-CM | POA: Diagnosis not present

## 2021-08-08 DIAGNOSIS — Z794 Long term (current) use of insulin: Secondary | ICD-10-CM | POA: Diagnosis not present

## 2021-09-19 ENCOUNTER — Ambulatory Visit (AMBULATORY_SURGERY_CENTER): Payer: HMO

## 2021-09-19 ENCOUNTER — Encounter: Payer: Self-pay | Admitting: Gastroenterology

## 2021-09-19 ENCOUNTER — Other Ambulatory Visit: Payer: Self-pay

## 2021-09-19 VITALS — Ht 64.0 in | Wt 230.0 lb

## 2021-09-19 DIAGNOSIS — Z86010 Personal history of colonic polyps: Secondary | ICD-10-CM

## 2021-09-19 MED ORDER — SUTAB 1479-225-188 MG PO TABS: 1.0000 | ORAL_TABLET | ORAL | 0 refills | Status: DC

## 2021-09-19 NOTE — Progress Notes (Signed)
No egg or soy allergy known to patient  ?No issues known to pt with past sedation with any surgeries or procedures ?Patient denies ever being told they had issues or difficulty with intubation  ?No FH of Malignant Hyperthermia ?Pt is not on diet pills ?Pt is not on home 02  ?Pt is not on blood thinners  ?Pt denies issues with constipation;  ?No A fib or A flutter ?Pt is fully vaccinated for Covid x 2; ?Medicare Coupon to pt in PV today and  NO PA's for preps discussed with pt in PV today  ?Discussed with pt there will be an out-of-pocket cost for prep and that varies from $0 to 70 + dollars - pt verbalized understanding  ?Due to the COVID-19 pandemic we are asking patients to follow certain guidelines in PV and the [place]   ?Pt aware of COVID protocols and [facility] guidelines  ?PV completed over the phone. Pt verified name, DOB, address and insurance during PV today.  ?Pt mailed instruction packet with copy of consent form to read and not return, and instructions.  ?Pt encouraged to call with questions or issues.  ?If pt has My chart, procedure instructions sent via My Chart  ? ?Patient is requesting to pick up instructions from office today- instructions printed and placed at desk for patient pick up; ?

## 2021-09-22 ENCOUNTER — Telehealth: Payer: Self-pay | Admitting: Gastroenterology

## 2021-09-22 DIAGNOSIS — Z86010 Personal history of colonic polyps: Secondary | ICD-10-CM

## 2021-09-22 MED ORDER — SUTAB 1479-225-188 MG PO TABS: 1.0000 | ORAL_TABLET | ORAL | 0 refills | Status: DC

## 2021-09-22 NOTE — Telephone Encounter (Signed)
Sent sutab Rx to [facility] per  pt's request.  ?

## 2021-09-26 ENCOUNTER — Encounter: Payer: HMO | Admitting: Gastroenterology

## 2021-09-26 ENCOUNTER — Telehealth: Payer: Self-pay | Admitting: Gastroenterology

## 2021-09-26 NOTE — Telephone Encounter (Signed)
Margarette Asal, ?This patient is scheduled for a colonoscopy with you at 1030 today. ? ?She called the answering service at 730 last evening reporting that she had vomited shortly after taking her entire dose of Sutab.  She had not yet started having any bowel movements. ? ?I offered to give her instructions for a MiraLAX preparation, but she preferred to cancel the procedure and reschedule for another day with a different preparation. ? ?- HD ?

## 2021-09-26 NOTE — Telephone Encounter (Signed)
Spoke with the patient. Offered to help her reschedule her colonoscopy. She wants to call us back. She is not feeling well with her stomach right now. Suggested bland foods and clear fluids, progressing as tolerated. ?Thanks me for the call. States she will call us back. ?

## 2021-09-26 NOTE — Telephone Encounter (Signed)
Thanks Mallie Mussel.  ?Beth, can you please schedule patient for office visit to discuss options. Thanks ?

## 2021-10-23 ENCOUNTER — Ambulatory Visit: Payer: HMO | Admitting: Podiatry

## 2021-11-18 DIAGNOSIS — E139 Other specified diabetes mellitus without complications: Secondary | ICD-10-CM | POA: Diagnosis not present

## 2021-11-18 DIAGNOSIS — E7800 Pure hypercholesterolemia, unspecified: Secondary | ICD-10-CM | POA: Diagnosis not present

## 2021-11-18 DIAGNOSIS — I10 Essential (primary) hypertension: Secondary | ICD-10-CM | POA: Diagnosis not present

## 2021-11-18 DIAGNOSIS — Z794 Long term (current) use of insulin: Secondary | ICD-10-CM | POA: Diagnosis not present

## 2021-11-18 DIAGNOSIS — Z6839 Body mass index (BMI) 39.0-39.9, adult: Secondary | ICD-10-CM | POA: Diagnosis not present

## 2021-11-18 DIAGNOSIS — K30 Functional dyspepsia: Secondary | ICD-10-CM | POA: Diagnosis not present

## 2021-11-26 DIAGNOSIS — E559 Vitamin D deficiency, unspecified: Secondary | ICD-10-CM | POA: Diagnosis not present

## 2021-11-26 DIAGNOSIS — I10 Essential (primary) hypertension: Secondary | ICD-10-CM | POA: Diagnosis not present

## 2021-11-26 DIAGNOSIS — E139 Other specified diabetes mellitus without complications: Secondary | ICD-10-CM | POA: Diagnosis not present

## 2021-11-26 DIAGNOSIS — E7800 Pure hypercholesterolemia, unspecified: Secondary | ICD-10-CM | POA: Diagnosis not present

## 2021-12-03 DIAGNOSIS — N952 Postmenopausal atrophic vaginitis: Secondary | ICD-10-CM | POA: Diagnosis not present

## 2021-12-03 DIAGNOSIS — Z862 Personal history of diseases of the blood and blood-forming organs and certain disorders involving the immune mechanism: Secondary | ICD-10-CM | POA: Diagnosis not present

## 2021-12-03 DIAGNOSIS — Z0000 Encounter for general adult medical examination without abnormal findings: Secondary | ICD-10-CM | POA: Diagnosis not present

## 2021-12-03 DIAGNOSIS — B079 Viral wart, unspecified: Secondary | ICD-10-CM | POA: Diagnosis not present

## 2021-12-03 DIAGNOSIS — I10 Essential (primary) hypertension: Secondary | ICD-10-CM | POA: Diagnosis not present

## 2021-12-03 DIAGNOSIS — E7800 Pure hypercholesterolemia, unspecified: Secondary | ICD-10-CM | POA: Diagnosis not present

## 2021-12-03 DIAGNOSIS — E139 Other specified diabetes mellitus without complications: Secondary | ICD-10-CM | POA: Diagnosis not present

## 2021-12-03 DIAGNOSIS — Z23 Encounter for immunization: Secondary | ICD-10-CM | POA: Diagnosis not present

## 2021-12-03 DIAGNOSIS — Z789 Other specified health status: Secondary | ICD-10-CM | POA: Diagnosis not present

## 2021-12-10 DIAGNOSIS — Z794 Long term (current) use of insulin: Secondary | ICD-10-CM | POA: Diagnosis not present

## 2021-12-10 DIAGNOSIS — Z6839 Body mass index (BMI) 39.0-39.9, adult: Secondary | ICD-10-CM | POA: Diagnosis not present

## 2021-12-10 DIAGNOSIS — E7800 Pure hypercholesterolemia, unspecified: Secondary | ICD-10-CM | POA: Diagnosis not present

## 2021-12-10 DIAGNOSIS — I10 Essential (primary) hypertension: Secondary | ICD-10-CM | POA: Diagnosis not present

## 2021-12-10 DIAGNOSIS — E139 Other specified diabetes mellitus without complications: Secondary | ICD-10-CM | POA: Diagnosis not present

## 2021-12-10 DIAGNOSIS — K30 Functional dyspepsia: Secondary | ICD-10-CM | POA: Diagnosis not present

## 2022-03-31 DIAGNOSIS — E559 Vitamin D deficiency, unspecified: Secondary | ICD-10-CM | POA: Diagnosis not present

## 2022-03-31 DIAGNOSIS — I10 Essential (primary) hypertension: Secondary | ICD-10-CM | POA: Diagnosis not present

## 2022-03-31 DIAGNOSIS — Z794 Long term (current) use of insulin: Secondary | ICD-10-CM | POA: Diagnosis not present

## 2022-03-31 DIAGNOSIS — E139 Other specified diabetes mellitus without complications: Secondary | ICD-10-CM | POA: Diagnosis not present

## 2022-04-10 ENCOUNTER — Encounter: Payer: Self-pay | Admitting: Gastroenterology

## 2022-05-22 ENCOUNTER — Ambulatory Visit (AMBULATORY_SURGERY_CENTER): Payer: Self-pay

## 2022-05-22 VITALS — Ht 64.0 in | Wt 227.0 lb

## 2022-05-22 DIAGNOSIS — Z86010 Personal history of colonic polyps: Secondary | ICD-10-CM

## 2022-05-22 MED ORDER — ONDANSETRON HCL 4 MG PO TABS: 4.0000 mg | ORAL_TABLET | ORAL | 0 refills | Status: AC

## 2022-05-22 MED ORDER — PEG 3350-KCL-NA BICARB-NACL 420 G PO SOLR: 4000.0000 mL | Freq: Once | ORAL | 0 refills | Status: AC

## 2022-05-22 NOTE — Progress Notes (Signed)

## 2022-06-09 ENCOUNTER — Encounter: Payer: Self-pay | Admitting: Gastroenterology

## 2022-06-17 ENCOUNTER — Encounter: Payer: Self-pay | Admitting: Gastroenterology

## 2022-06-17 ENCOUNTER — Ambulatory Visit (AMBULATORY_SURGERY_CENTER): Payer: HMO | Admitting: Gastroenterology

## 2022-06-17 VITALS — BP 162/79 | HR 72 | Temp 97.1°F | Resp 18 | Ht 63.5 in | Wt 232.2 lb

## 2022-06-17 DIAGNOSIS — Z09 Encounter for follow-up examination after completed treatment for conditions other than malignant neoplasm: Secondary | ICD-10-CM

## 2022-06-17 DIAGNOSIS — D122 Benign neoplasm of ascending colon: Secondary | ICD-10-CM

## 2022-06-17 DIAGNOSIS — Z86010 Personal history of colonic polyps: Secondary | ICD-10-CM | POA: Diagnosis not present

## 2022-06-17 MED ORDER — SODIUM CHLORIDE 0.9 % IV SOLN: 500.0000 mL | Freq: Once | INTRAVENOUS | Status: DC

## 2022-06-17 NOTE — Progress Notes (Unsigned)
Called to room to assist during endoscopic procedure.  Patient ID and intended procedure confirmed with present staff. Received instructions for my participation in the procedure from the performing physician.  

## 2022-06-17 NOTE — Progress Notes (Signed)
[facility] History and Physical   Primary Care Physician:  Janie Morning, DO   Reason for Procedure:  History of adenomatous colon polyps  Plan:    Surveillance colonoscopy with possible interventions as needed     HPI: Laurie Payne is a very pleasant 71 y.o. female here for surveillance colonoscopy. Denies any nausea, vomiting, abdominal pain, melena or bright red blood per rectum  The risks and benefits as well as alternatives of endoscopic procedure(s) have been discussed and reviewed. All questions answered. The patient agrees to proceed.    Past Medical History:  Diagnosis Date   Arthritis    RIGHT hip   Cataract    bilateral sx   Diabetes mellitus without complication ([facility])    on meds   GERD (gastroesophageal reflux disease)    on meds   Hyperlipidemia    on meds   Hypertension    on meds    Past Surgical History:  Procedure Laterality Date   CATARACT EXTRACTION Bilateral    TONSILLECTOMY     TOTAL ABDOMINAL HYSTERECTOMY  07/20/1986   WISDOM TOOTH EXTRACTION      Prior to Admission medications   Medication Sig Start Date End Date Taking? Authorizing Provider  amLODipine (NORVASC) 5 MG tablet Take 5 mg by mouth daily.   Yes [provider]  glucose blood (ONETOUCH ULTRA) test strip TEST TWICE DAILY; FINGERSTICK.   Yes [provider]  insulin degludec (TRESIBA FLEXTOUCH) 100 UNIT/ML SOPN FlexTouch Pen Inject 20 Units into the skin daily.    Yes [provider]  Insulin Pen Needle (BD PEN NEEDLE NANO 2ND GEN) 32G X 4 MM MISC daily.   Yes [provider]  losartan (COZAAR) 100 MG tablet Take 100 mg by mouth daily.   Yes [provider]  ondansetron (ZOFRAN) 4 MG tablet Take 1 tablet (4 mg total) by mouth as directed. Take one Zofran 4 mg tablet 30-60 minutes before each colonoscopy prep dose 05/22/22  Yes Yadira Hada V, MD  azelastine (ASTELIN) 0.1 % nasal spray Place 1 spray into both  nostrils daily as needed. Patient not taking: Reported on 05/22/2022 07/27/19   [provider]  Continuous Blood Gluc Receiver (FREESTYLE LIBRE 2 READER) [place] See admin instructions. 09/16/21   [provider]  Continuous Blood Gluc Sensor (FREESTYLE LIBRE 2 SENSOR) MISC See admin instructions. 09/16/21   [provider]  Dulaglutide (TRULICITY) 1.5 NL/9.7QB SOPN Inject 1.5 mg into the skin once a week.    [provider]  lansoprazole (PREVACID) 15 MG capsule Take 15 mg by mouth daily at 12 noon. Patient not taking: Reported on 06/17/2022    [provider]    Current Outpatient Medications  Medication Sig Dispense Refill   amLODipine (NORVASC) 5 MG tablet Take 5 mg by mouth daily.     glucose blood (ONETOUCH ULTRA) test strip TEST TWICE DAILY; FINGERSTICK.     insulin degludec (TRESIBA FLEXTOUCH) 100 UNIT/ML SOPN FlexTouch Pen Inject 20 Units into the skin daily.      Insulin Pen Needle (BD PEN NEEDLE NANO 2ND GEN) 32G X 4 MM MISC daily.     losartan (COZAAR) 100 MG tablet Take 100 mg by mouth daily.     ondansetron (ZOFRAN) 4 MG tablet Take 1 tablet (4 mg total) by mouth as directed. Take one Zofran 4 mg tablet 30-60 minutes before each colonoscopy prep dose 2 tablet 0   azelastine (ASTELIN) 0.1 % nasal spray Place 1 spray  into both nostrils daily as needed. (Patient not taking: Reported on 05/22/2022)     Continuous Blood Gluc Receiver (FREESTYLE LIBRE 2 READER) DEVI See admin instructions.     Continuous Blood Gluc Sensor (FREESTYLE LIBRE 2 SENSOR) MISC See admin instructions.     Dulaglutide (TRULICITY) 1.5 SV/7.7LT SOPN Inject 1.5 mg into the skin once a week.     lansoprazole (PREVACID) 15 MG capsule Take 15 mg by mouth daily at 12 noon. (Patient not taking: Reported on 06/17/2022)     Current Facility-Administered Medications  Medication Dose Route Frequency Provider Last Rate Last Admin   0.9 %  sodium chloride infusion  500 mL Intravenous  Once Mauri Pole, MD        Allergies as of 06/17/2022 - Review Complete 06/17/2022  Allergen Reaction Noted   Statins Diarrhea and Nausea And Vomiting 08/08/2021    Family History  Problem Relation Age of Onset   Colon cancer Neg Hx    Colon polyps Neg Hx    Esophageal cancer Neg Hx    Stomach cancer Neg Hx    Rectal cancer Neg Hx     Social History   Socioeconomic History   Marital status: Single    Spouse name: Not on file   Number of children: Not on file   Years of education: Not on file   Highest education level: Not on file  Occupational History   Not on file  Tobacco Use   Smoking status: Never   Smokeless tobacco: Never  Vaping Use   Vaping Use: Never used  Substance and Sexual Activity   Alcohol use: No   Drug use: No   Sexual activity: Not on file  Other Topics Concern   Not on file  Social History Narrative   Not on file   Social Determinants of Health   Financial Resource Strain: Not on file  Food Insecurity: No Food Insecurity (02/22/2020)   Hunger Vital Sign    Worried About Running Out of Food in the Last Year: Never true    Ran Out of Food in the Last Year: Never true  Transportation Needs: No Transportation Needs (02/22/2020)   PRAPARE - Hydrologist (Medical): No    Lack of Transportation (Non-Medical): No  Physical Activity: Not on file  Stress: Not on file  Social Connections: Not on file  Intimate Partner Violence: Not on file    Review of Systems:  All other review of systems negative except as mentioned in the HPI.  Physical Exam: Vital signs in last 24 hours: Blood Pressure (Abnormal) 151/76   Pulse 77   Temperature (Abnormal) 97.1 F (36.2 C)   Height 5' 3.5" (1.613 m)   Weight 232 lb 3.2 oz (105.3 kg)   Oxygen Saturation 96%   Body Mass Index 40.49 kg/m  General:   Alert, NAD Lungs:  Clear .   Heart:  Regular rate and rhythm Abdomen:  Soft, nontender and nondistended. Neuro/Psych:   Alert and cooperative. Normal mood and affect. A and O x 3  Reviewed labs, radiology imaging, old records and pertinent past GI work up  Patient is appropriate for planned procedure(s) and anesthesia in an ambulatory setting   K. Denzil Magnuson , MD [phone]

## 2022-06-17 NOTE — Progress Notes (Unsigned)
Pt's states no medical or surgical changes since previsit or office visit. 

## 2022-06-17 NOTE — Patient Instructions (Addendum)
Handouts on polyps, hemorrhoids, and diverticulosis given to you today  Await pathology results   YOU HAD AN ENDOSCOPIC PROCEDURE TODAY AT [facility]:   Refer to the procedure report that was given to you for any specific questions about what was found during the examination.  If the procedure report does not answer your questions, please call your gastroenterologist to clarify.  If you requested that your care partner not be given the details of your procedure findings, then the procedure report has been included in a sealed envelope for you to review at your convenience later.  YOU SHOULD EXPECT: Some feelings of bloating in the abdomen. Passage of more gas than usual.  Walking can help get rid of the air that was put into your GI tract during the procedure and reduce the bloating. If you had a lower endoscopy (such as a colonoscopy or flexible sigmoidoscopy) you may notice spotting of blood in your stool or on the toilet paper. If you underwent a bowel prep for your procedure, you may not have a normal bowel movement for a few days.  Please Note:  You might notice some irritation and congestion in your nose or some drainage.  This is from the oxygen used during your procedure.  There is no need for concern and it should clear up in a day or so.  SYMPTOMS TO REPORT IMMEDIATELY:  Following lower endoscopy (colonoscopy or flexible sigmoidoscopy):  Excessive amounts of blood in the stool  Significant tenderness or worsening of abdominal pains  Swelling of the abdomen that is new, acute  Fever of 100F or higher  For urgent or emergent issues, a gastroenterologist can be reached at any hour by calling [phone]. Do not use MyChart messaging for urgent concerns.    DIET:  We do recommend a small meal at first, but then you may proceed to your regular diet.  Drink plenty of fluids but you should avoid alcoholic beverages for 24 hours.  ACTIVITY:  You should plan to take it  easy for the rest of today and you should NOT DRIVE or use heavy machinery until tomorrow (because of the sedation medicines used during the test).    FOLLOW UP: Our staff will call the number listed on your records the next business day following your procedure.  We will call around 7:15- 8:00 am to check on you and address any questions or concerns that you may have regarding the information given to you following your procedure. If we do not reach you, we will leave a message.     If any biopsies were taken you will be contacted by phone or by letter within the next 1-3 weeks.  Please call us at [phone] if you have not heard about the biopsies in 3 weeks.    SIGNATURES/CONFIDENTIALITY: You and/or your care partner have signed paperwork which will be entered into your electronic medical record.  These signatures attest to the fact that that the information above on your After Visit Summary has been reviewed and is understood.  Full responsibility of the confidentiality of this discharge information lies with you and/or your care-partner.

## 2022-06-17 NOTE — Progress Notes (Unsigned)
0826 Pt started coughing and spitting small amounts of saliva looking fluid.  They had just started giving abd pressure.  Oral suctioning started Abd pressure halted and sedation halted and pt allowed to wake up enough to follow commands to open her mouth and swallow.  We had not reached cecum yet so did nother trial of light sedation.  Abd pressure was still needed and Dr Silverio Decamp asked for abd binder. Abd binder applied Pt started coughing/spitting again and abd binder released.  Suctioned oral gain and sedation completely stopped.  Sats never dropped.  BS clear in PACU

## 2022-06-17 NOTE — Op Note (Addendum)
[facility] Patient Name: Laurie Payne Procedure Date: 06/17/2022 7:25 AM MRN: [id number] Endoscopist: Mauri Pole , MD, [id number] Age: 71 Referring MD:  Date of Birth: 07/07/1951 Gender: Female Account #: [id number] Procedure:                Colonoscopy Indications:              High risk colon cancer surveillance: Personal                            history of colonic polyps, High risk colon cancer                            surveillance: Personal history of adenoma less than                            10 mm in size Medicines:                Monitored Anesthesia Care Procedure:                Pre-Anesthesia Assessment:                           - Prior to the procedure, a History and Physical                            was performed, and patient medications and                            allergies were reviewed. The patient's tolerance of                            previous anesthesia was also reviewed. The risks                            and benefits of the procedure and the sedation                            options and risks were discussed with the patient.                            All questions were answered, and informed consent                            was obtained. Prior Anticoagulants: The patient has                            taken no anticoagulant or antiplatelet agents. ASA                            Grade Assessment: II - A patient with mild systemic                            disease. After reviewing the risks and benefits,  the patient was deemed in satisfactory condition to                            undergo the procedure.                           After obtaining informed consent, the colonoscope                            was passed under direct vision. Throughout the                            procedure, the patient's blood pressure, pulse, and                            oxygen saturations were monitored  continuously. The                            Olympus PCF-[id number] (#[id number]) Colonoscope was                            introduced through the anus and advanced to the the                            cecum, identified by appendiceal orifice and                            ileocecal valve. The colonoscopy was performed                            without difficulty. The patient tolerated the                            procedure well. The quality of the bowel                            preparation was adequate. The ileocecal valve,                            appendiceal orifice, and rectum were photographed. Scope In: 8:13:56 AM Scope Out: 8:43:29 AM Scope Withdrawal Time: 0 hours 6 minutes 7 seconds  Total Procedure Duration: 0 hours 29 minutes 33 seconds  Findings:                 The perianal and digital rectal examinations were                            normal.                           A 3 mm polyp was found in the ascending colon. The                            polyp was sessile. The polyp was removed with a  cold snare. Resection and retrieval were complete.                           A few small-mouthed diverticula were found in the                            sigmoid colon.                           Non-bleeding external and internal hemorrhoids were                            found during retroflexion. The hemorrhoids were                            medium-sized. Complications:            No immediate complications. Estimated Blood Loss:     Estimated blood loss was minimal. Impression:               - One 3 mm polyp in the ascending colon, removed                            with a cold snare. Resected and retrieved.                           - Diverticulosis in the sigmoid colon.                           - Non-bleeding external and internal hemorrhoids. Recommendation:           - Patient has a contact number available for                             emergencies. The signs and symptoms of potential                            delayed complications were discussed with the                            patient. Return to normal activities tomorrow.                            Written discharge instructions were provided to the                            patient.                           - Resume previous diet.                           - Continue present medications.                           - Await pathology results.                           -  No repeat colonoscopy due to age. Mauri Pole, MD 06/17/2022 8:50:31 AM This report has been signed electronically.

## 2022-06-18 ENCOUNTER — Telehealth: Payer: Self-pay | Admitting: *Deleted

## 2022-06-18 ENCOUNTER — Encounter: Payer: Self-pay | Admitting: Gastroenterology

## 2022-06-18 NOTE — Telephone Encounter (Signed)
  Follow up Call-     06/17/2022    7:18 AM  Call back number  Post procedure Call Back phone  # [phone]  Permission to leave phone message Yes     Patient questions:  Do you have a fever, pain , or abdominal swelling? No. Pain Score  0 *  Have you tolerated food without any problems? Yes.    Have you been able to return to your normal activities? Yes.    Do you have any questions about your discharge instructions: Diet   No. Medications  No. Follow up visit  No.  Do you have questions or concerns about your Care? No.  Actions: * If pain score is 4 or above: No action needed, pain <4.

## 2022-06-25 ENCOUNTER — Encounter: Payer: Self-pay | Admitting: Gastroenterology

## 2022-07-27 DIAGNOSIS — R059 Cough, unspecified: Secondary | ICD-10-CM | POA: Diagnosis not present

## 2022-07-27 DIAGNOSIS — R0989 Other specified symptoms and signs involving the circulatory and respiratory systems: Secondary | ICD-10-CM | POA: Diagnosis not present

## 2022-07-27 DIAGNOSIS — M25551 Pain in right hip: Secondary | ICD-10-CM | POA: Diagnosis not present

## 2022-07-27 DIAGNOSIS — M549 Dorsalgia, unspecified: Secondary | ICD-10-CM | POA: Diagnosis not present

## 2022-07-31 DIAGNOSIS — E139 Other specified diabetes mellitus without complications: Secondary | ICD-10-CM | POA: Diagnosis not present

## 2022-07-31 DIAGNOSIS — I10 Essential (primary) hypertension: Secondary | ICD-10-CM | POA: Diagnosis not present

## 2022-07-31 DIAGNOSIS — E559 Vitamin D deficiency, unspecified: Secondary | ICD-10-CM | POA: Diagnosis not present

## 2022-07-31 DIAGNOSIS — E7800 Pure hypercholesterolemia, unspecified: Secondary | ICD-10-CM | POA: Diagnosis not present

## 2022-08-06 DIAGNOSIS — I10 Essential (primary) hypertension: Secondary | ICD-10-CM | POA: Diagnosis not present

## 2022-08-06 DIAGNOSIS — E139 Other specified diabetes mellitus without complications: Secondary | ICD-10-CM | POA: Diagnosis not present

## 2022-08-06 DIAGNOSIS — E559 Vitamin D deficiency, unspecified: Secondary | ICD-10-CM | POA: Diagnosis not present

## 2022-08-06 DIAGNOSIS — E7800 Pure hypercholesterolemia, unspecified: Secondary | ICD-10-CM | POA: Diagnosis not present

## 2022-08-25 DIAGNOSIS — I10 Essential (primary) hypertension: Secondary | ICD-10-CM | POA: Diagnosis not present

## 2022-08-25 DIAGNOSIS — Z9109 Other allergy status, other than to drugs and biological substances: Secondary | ICD-10-CM | POA: Diagnosis not present

## 2022-12-09 DIAGNOSIS — Z79899 Other long term (current) drug therapy: Secondary | ICD-10-CM | POA: Diagnosis not present

## 2022-12-09 DIAGNOSIS — I10 Essential (primary) hypertension: Secondary | ICD-10-CM | POA: Diagnosis not present

## 2022-12-09 DIAGNOSIS — Z0000 Encounter for general adult medical examination without abnormal findings: Secondary | ICD-10-CM | POA: Diagnosis not present

## 2022-12-09 DIAGNOSIS — M1990 Unspecified osteoarthritis, unspecified site: Secondary | ICD-10-CM | POA: Diagnosis not present

## 2022-12-09 DIAGNOSIS — Z9109 Other allergy status, other than to drugs and biological substances: Secondary | ICD-10-CM | POA: Diagnosis not present

## 2022-12-09 DIAGNOSIS — R946 Abnormal results of thyroid function studies: Secondary | ICD-10-CM | POA: Diagnosis not present

## 2023-01-29 DIAGNOSIS — E139 Other specified diabetes mellitus without complications: Secondary | ICD-10-CM | POA: Diagnosis not present

## 2023-01-29 DIAGNOSIS — E7800 Pure hypercholesterolemia, unspecified: Secondary | ICD-10-CM | POA: Diagnosis not present

## 2023-01-29 DIAGNOSIS — R946 Abnormal results of thyroid function studies: Secondary | ICD-10-CM | POA: Diagnosis not present

## 2023-01-29 DIAGNOSIS — Z79899 Other long term (current) drug therapy: Secondary | ICD-10-CM | POA: Diagnosis not present

## 2023-02-05 DIAGNOSIS — Z789 Other specified health status: Secondary | ICD-10-CM | POA: Diagnosis not present

## 2023-02-05 DIAGNOSIS — R0789 Other chest pain: Secondary | ICD-10-CM | POA: Diagnosis not present

## 2023-02-05 DIAGNOSIS — E139 Other specified diabetes mellitus without complications: Secondary | ICD-10-CM | POA: Diagnosis not present

## 2023-02-05 DIAGNOSIS — E7800 Pure hypercholesterolemia, unspecified: Secondary | ICD-10-CM | POA: Diagnosis not present

## 2023-02-05 DIAGNOSIS — I10 Essential (primary) hypertension: Secondary | ICD-10-CM | POA: Diagnosis not present

## 2023-04-12 ENCOUNTER — Ambulatory Visit
Admission: EM | Admit: 2023-04-12 | Discharge: 2023-04-12 | Disposition: A | Discharge status: Home / Self Care | Payer: HMO | Attending: Physician Assistant | Admitting: Physician Assistant

## 2023-04-12 DIAGNOSIS — J069 Acute upper respiratory infection, unspecified: Secondary | ICD-10-CM | POA: Insufficient documentation

## 2023-04-12 DIAGNOSIS — U071 COVID-19: Secondary | ICD-10-CM | POA: Diagnosis not present

## 2023-04-12 DIAGNOSIS — R051 Acute cough: Secondary | ICD-10-CM | POA: Insufficient documentation

## 2023-04-12 LAB — POCT INFLUENZA A/B
Influenza A, POC: NEGATIVE
Influenza B, POC: NEGATIVE

## 2023-04-12 MED ORDER — BENZONATATE 100 MG PO CAPS: 100.0000 mg | ORAL_CAPSULE | Freq: Three times a day (TID) | ORAL | 0 refills | Status: AC

## 2023-04-12 NOTE — ED Triage Notes (Signed)
Pt presents to UC w/ c/o chills, headache, sore throat, nonproductive cough x3 days.  Taking dayquil, elderberry without relief.

## 2023-04-12 NOTE — Discharge Instructions (Addendum)
Recommend Flonase and Mucinex Can take Tessalon as needed for cough Can take over the counter Delsym as needed for cough Drink plenty of fluids, rest.  Will call with test results

## 2023-04-12 NOTE — ED Provider Notes (Signed)
[name]    CSN: [id number] Arrival date & time: 04/12/23  1506      History   Chief Complaint No chief complaint on file.   HPI Laurie Payne is a 72 y.o. female.   Patient complains of cough, headache, congestion, body aches, sore throat that started 3 days ago.  She has been taking DayQuil and elderberry syrup without relief.  She denies wheezing or shortness of breath.  She denies sick contacts.      Past Medical History:  Diagnosis Date   Arthritis    RIGHT hip   Cataract    bilateral sx   Diabetes mellitus without complication ([facility])    on meds   GERD (gastroesophageal reflux disease)    on meds   Hyperlipidemia    on meds   Hypertension    on meds    Patient Active Problem List   Diagnosis Date Noted   DM (diabetes mellitus) type 2, uncontrolled, with ketoacidosis (HCC) 08/16/2019   Hypertension associated with diabetes (HCC) 08/16/2019   Hyperlipidemia associated with type 2 diabetes mellitus ([facility]CC) 08/16/2019   Seasonal allergies 08/16/2019    Past Surgical History:  Procedure Laterality Date   CATARACT EXTRACTION Bilateral    TONSILLECTOMY     TOTAL ABDOMINAL HYSTERECTOMY  07/20/1986   WISDOM TOOTH EXTRACTION      OB History   No obstetric history on file.      Home Medications    Prior to Admission medications   Medication Sig Start Date End Date Taking? Authorizing Provider  benzonatate (TESSALON) 100 MG capsule Take 1 capsule (100 mg total) by mouth every 8 (eight) hours. 04/12/23  Yes Ward, Tylene Fantasia, PA-C  amLODipine (NORVASC) 5 MG tablet Take 5 mg by mouth daily.    [provider]  azelastine (ASTELIN) 0.1 % nasal spray Place 1 spray into both nostrils daily as needed. Patient not taking: Reported on 05/22/2022 07/27/19   [provider]  Continuous Blood Gluc Receiver (FREESTYLE LIBRE 2 READER) DEVI See admin instructions. 09/16/21   [provider]  Continuous Blood Gluc Sensor (FREESTYLE  LIBRE 2 SENSOR) MISC See admin instructions. 09/16/21   [provider]  Dulaglutide (TRULICITY) 1.5 MG/0.5ML SOPN Inject 1.5 mg into the skin once a week.    [provider]  glucose blood (ONETOUCH ULTRA) test strip TEST TWICE DAILY; FINGERSTICK.    [provider]  insulin degludec (TRESIBA FLEXTOUCH) 100 UNIT/ML SOPN FlexTouch Pen Inject 20 Units into the skin daily.     [provider]  Insulin Pen Needle (BD PEN NEEDLE NANO 2ND GEN) 32G X 4 MM MISC daily.    [provider]  lansoprazole (PREVACID) 15 MG capsule Take 15 mg by mouth daily at 12 noon. Patient not taking: Reported on 06/17/2022    [provider]  losartan (COZAAR) 100 MG tablet Take 100 mg by mouth daily.    [provider]  ondansetron (ZOFRAN) 4 MG tablet Take 1 tablet (4 mg total) by mouth as directed. Take one Zofran 4 mg tablet 30-60 minutes before each colonoscopy prep dose 05/22/22   Napoleon Form, MD    Family History Family History  Problem Relation Age of Onset   Colon cancer Neg Hx    Colon polyps Neg Hx    Esophageal cancer Neg Hx    Stomach cancer Neg Hx    Rectal cancer Neg Hx     Social History Social History  Tobacco Use   Smoking status: Never   Smokeless tobacco: Never  Vaping Use   Vaping status: Never Used  Substance Use Topics   Alcohol use: No   Drug use: No     Allergies   Statins   Review of Systems Review of Systems  Constitutional:  Positive for fatigue. Negative for chills and fever.  HENT:  Positive for congestion. Negative for ear pain and sore throat.   Eyes:  Negative for pain and visual disturbance.  Respiratory:  Positive for cough. Negative for shortness of breath.   Cardiovascular:  Negative for chest pain and palpitations.  Gastrointestinal:  Negative for abdominal pain and vomiting.  Genitourinary:  Negative for dysuria and hematuria.  Musculoskeletal:  Positive for myalgias. Negative for  arthralgias and back pain.  Skin:  Negative for color change and rash.  Neurological:  Negative for seizures and syncope.  All other systems reviewed and are negative.    Physical Exam Triage Vital Signs ED Triage Vitals  Encounter Vitals Group     BP 04/12/23 1633 (!) 146/82     Systolic BP Percentile --      Diastolic BP Percentile --      Pulse Rate 04/12/23 1633 82     Resp 04/12/23 1633 16     Temp 04/12/23 1633 98.2 F (36.8 C)     Temp Source 04/12/23 1633 Temporal     SpO2 04/12/23 1633 96 %     Weight --      Height --      Head Circumference --      Peak Flow --      Pain Score 04/12/23 1632 0     Pain Loc --      Pain Education --      Exclude from Growth Chart --    No data found.  Updated Vital Signs BP (!) 146/82 (BP Location: Right Arm)   Pulse 82   Temp 98.2 F (36.8 C) (Temporal)   Resp 16   SpO2 96%   Visual Acuity Right Eye Distance:   Left Eye Distance:   Bilateral Distance:    Right Eye Near:   Left Eye Near:    Bilateral Near:     Physical Exam Vitals and nursing note reviewed.  Constitutional:      General: She is not in acute distress.    Appearance: She is well-developed.  HENT:     Head: Normocephalic and atraumatic.  Eyes:     Conjunctiva/sclera: Conjunctivae normal.  Cardiovascular:     Rate and Rhythm: Normal rate and regular rhythm.     Heart sounds: No murmur heard. Pulmonary:     Effort: Pulmonary effort is normal. No respiratory distress.     Breath sounds: Normal breath sounds.     Comments: Coughing Abdominal:     Palpations: Abdomen is soft.     Tenderness: There is no abdominal tenderness.  Musculoskeletal:        General: No swelling.     Cervical back: Neck supple.  Skin:    General: Skin is warm and dry.     Capillary Refill: Capillary refill takes less than 2 seconds.  Neurological:     Mental Status: She is alert.  Psychiatric:        Mood and Affect: Mood normal.      UC Treatments / Results   Labs (all labs ordered are listed, but only abnormal results are displayed) Labs Reviewed  SARS CORONAVIRUS  2 (TAT 6-24 HRS)  POCT INFLUENZA A/B    EKG   Radiology No results found.  Procedures Procedures (including critical care time)  Medications Ordered in UC Medications - No data to display  Initial Impression / Assessment and Plan / UC Course  I have reviewed the triage vital signs and the nursing notes.  Pertinent labs & imaging results that were available during my care of the patient were reviewed by me and considered in my medical decision making (see chart for details).     URI.  COVID/flu test pending, consider Paxlovid if COVID test is positive.  Supportive care discussed.  Tessalon prescribed for cough.  Patient overall well-appearing in no acute distress, vitals within normal limits.  Lungs clear to auscultation.  Return precautions discussed. Final Clinical Impressions(s) / UC Diagnoses   Final diagnoses:  Acute cough  Acute upper respiratory infection     Discharge Instructions      Recommend Flonase and Mucinex Can take Tessalon as needed for cough Can take over the counter Delsym as needed for cough Drink plenty of fluids, rest.  Will call with test results    ED Prescriptions     Medication Sig Dispense Auth. Provider   benzonatate (TESSALON) 100 MG capsule Take 1 capsule (100 mg total) by mouth every 8 (eight) hours. 21 capsule Ward, Tylene Fantasia, PA-C      PDMP not reviewed this encounter.   Ward, Tylene Fantasia, PA-C 04/12/23 [phone]

## 2023-04-13 ENCOUNTER — Telehealth: Payer: Self-pay

## 2023-04-13 LAB — SARS CORONAVIRUS 2 (TAT 6-24 HRS): SARS Coronavirus 2: POSITIVE — AB

## 2023-04-13 MED ORDER — MOLNUPIRAVIR EUA 200MG CAPSULE: 4.0000 | ORAL_CAPSULE | Freq: Two times a day (BID) | ORAL | 0 refills | Status: AC

## 2023-04-13 NOTE — Telephone Encounter (Signed)
Pt is candidate for anti-virals per provider note. Per Karna Christmas, MD "we can do Molnupiravir per protocol." Reviewed with patient, verified pharmacy, prescription sent.

## 2023-04-16 ENCOUNTER — Ambulatory Visit: Payer: HMO | Admitting: Internal Medicine

## 2023-06-02 ENCOUNTER — Ambulatory Visit: Payer: HMO | Attending: Internal Medicine | Admitting: Internal Medicine

## 2023-06-02 VITALS — BP 140/72 | HR 82 | Ht 64.0 in | Wt 236.2 lb

## 2023-06-02 DIAGNOSIS — R079 Chest pain, unspecified: Secondary | ICD-10-CM

## 2023-06-02 NOTE — Patient Instructions (Addendum)
Medication Instructions:  No changes   *If you need a refill on your cardiac medications before your next appointment, please call your pharmacy*   Lab Work: None    If you have labs (blood work) drawn today and your tests are completely normal, you will receive your results only by: MyChart Message (if you have MyChart) OR A paper copy in the mail If you have any lab test that is abnormal or we need to change your treatment, we will call you to review the results.   Testing/Procedures: Echo will be scheduled at [address].  Your physician has requested that you have an echocardiogram. Echocardiography is a painless test that uses sound waves to create images of your heart. It provides your doctor with information about the size and shape of your heart and how well your heart's chambers and valves are working. This procedure takes approximately one hour. There are no restrictions for this procedure. Please do NOT wear cologne, perfume, aftershave, or lotions (deodorant is allowed). Please arrive 15 minutes prior to your appointment time.    Follow-Up: At [facility], you and your health needs are our priority.  As part of our continuing mission to provide you with exceptional heart care, we have created designated Provider Care Teams.  These Care Teams include your primary Cardiologist (physician) and Advanced Practice Providers (APPs -  Physician Assistants and Nurse Practitioners) who all work together to provide you with the care you need, when you need it.  We recommend signing up for the patient portal called "MyChart".  Sign up information is provided on this After Visit Summary.  MyChart is used to connect with patients for Virtual Visits (Telemedicine).  Patients are able to view lab/test results, encounter notes, upcoming appointments, etc.  Non-urgent messages can be sent to your provider as well.   To learn more about what you can do with MyChart, go to  [email or web address].    Your next appointment:   Follow up as needed pending results of ECHO.    Provider:   Maisie Fus, MD    Other Instructions

## 2023-06-02 NOTE — Progress Notes (Signed)
  Cardiology Office Note:  .   Date:  06/02/2023  ID:  Laurie Payne, DOB [birth year], MRN [id number] PCP: Laurie Reichmann, DO   HeartCare Providers Cardiologist:  None    History of Present Illness: .   Laurie Payne is a 72 y.o. female with history of hypertension, DM2 who was sent as a referral to cardiology for dizziness upon standing.  She notes that in the morning when she gets to the side of her bed she has to wait a while until she can stand and even with standing she feels unsteady on her feet.  She has not had a syncopal episode.  She denies any history of cardiovascular disease and has not had a cardiac workup in the past.   ROS:  per HPI otherwise negative   Studies Reviewed: Marland Kitchen        EKG Interpretation Date/Time:  Wednesday June 02 2023 14:40:27 EST Ventricular Rate:  82 PR Interval:  158 QRS Duration:  70 QT Interval:  374 QTC Calculation: 436 R Axis:   -21  Text Interpretation: Normal sinus rhythm Minimal voltage criteria for LVH, may be normal variant ( R in aVL ) No previous ECGs available Confirmed by Carolan Clines ([id number]) on 06/02/2023 2:52:26 PM  Risk Assessment/Calculations:        Physical Exam:   VS:  BP (!) 140/72   Pulse 82   Ht 5\' 4"  (1.626 m)   Wt 236 lb 3.2 oz (107.1 kg)   SpO2 95%   BMI 40.54 kg/m    Wt Readings from Last 3 Encounters:  06/02/23 236 lb 3.2 oz (107.1 kg)  06/17/22 232 lb 3.2 oz (105.3 kg)  05/22/22 227 lb (103 kg)    GEN: Well nourished, well developed in no acute distress NECK: No JVD; No carotid bruits CARDIAC: RRR, SEM RESPIRATORY:  Clear to auscultation without rales, wheezing or rhonchi  ABDOMEN: Soft, non-tender, non-distended EXTREMITIES:  No edema; No deformity   ASSESSMENT AND PLAN: .   Dizziness SEM murmur She notes some dizziness from sitting to standing.  She has a soft systolic ejection murmur at the right upper sternal border.  May have AS or LVOT obstruction.  Her ECG does not suggest  HCM. -TTE       Dispo: Follow up as needed; pending the results  Signed, Lavanya Roa, Alben Spittle, MD

## 2023-06-22 DIAGNOSIS — M25551 Pain in right hip: Secondary | ICD-10-CM | POA: Diagnosis not present

## 2023-06-22 DIAGNOSIS — M4696 Unspecified inflammatory spondylopathy, lumbar region: Secondary | ICD-10-CM | POA: Diagnosis not present

## 2023-06-22 DIAGNOSIS — Z23 Encounter for immunization: Secondary | ICD-10-CM | POA: Diagnosis not present

## 2023-07-12 ENCOUNTER — Ambulatory Visit (HOSPITAL_COMMUNITY): Payer: PPO | Attending: Internal Medicine

## 2023-07-12 DIAGNOSIS — Z1231 Encounter for screening mammogram for malignant neoplasm of breast: Secondary | ICD-10-CM | POA: Diagnosis not present

## 2023-07-12 DIAGNOSIS — R011 Cardiac murmur, unspecified: Secondary | ICD-10-CM | POA: Diagnosis not present

## 2023-07-12 DIAGNOSIS — R079 Chest pain, unspecified: Secondary | ICD-10-CM | POA: Diagnosis not present

## 2023-07-12 LAB — ECHOCARDIOGRAM COMPLETE
Area-P 1/2: 4.46 cm2
S' Lateral: 2.6 cm

## 2023-08-09 DIAGNOSIS — E7800 Pure hypercholesterolemia, unspecified: Secondary | ICD-10-CM | POA: Diagnosis not present

## 2023-08-09 DIAGNOSIS — E139 Other specified diabetes mellitus without complications: Secondary | ICD-10-CM | POA: Diagnosis not present

## 2023-08-12 DIAGNOSIS — I10 Essential (primary) hypertension: Secondary | ICD-10-CM | POA: Diagnosis not present

## 2023-08-16 DIAGNOSIS — M47812 Spondylosis without myelopathy or radiculopathy, cervical region: Secondary | ICD-10-CM | POA: Diagnosis not present

## 2023-08-16 DIAGNOSIS — Z794 Long term (current) use of insulin: Secondary | ICD-10-CM | POA: Diagnosis not present

## 2023-08-16 DIAGNOSIS — I10 Essential (primary) hypertension: Secondary | ICD-10-CM | POA: Diagnosis not present

## 2023-08-16 DIAGNOSIS — E559 Vitamin D deficiency, unspecified: Secondary | ICD-10-CM | POA: Diagnosis not present

## 2023-08-16 DIAGNOSIS — E139 Other specified diabetes mellitus without complications: Secondary | ICD-10-CM | POA: Diagnosis not present

## 2023-08-16 DIAGNOSIS — Z6839 Body mass index (BMI) 39.0-39.9, adult: Secondary | ICD-10-CM | POA: Diagnosis not present

## 2023-08-16 DIAGNOSIS — Z789 Other specified health status: Secondary | ICD-10-CM | POA: Diagnosis not present

## 2023-08-16 DIAGNOSIS — E7800 Pure hypercholesterolemia, unspecified: Secondary | ICD-10-CM | POA: Diagnosis not present

## 2023-08-19 ENCOUNTER — Telehealth: Payer: Self-pay | Admitting: Internal Medicine

## 2023-08-19 NOTE — Telephone Encounter (Signed)
Patient calling in for her echo results. Please advise

## 2023-08-19 NOTE — Telephone Encounter (Signed)
Called and spoke to patient. Verified name and DOB. Below message relayed. Patient has no questions at this time.     Hi Mrs. Montano gave good news. Your echocardiogram showed normal heart function and you have no significant heart valve abnormalities.  I do not suspect your symptoms were heart related.  Recommend good hydration.  Have a wonderful holiday season. -Dr. Wyline Mood Written by Maisie Fus, MD on 07/13/2023  2:28 PM EST

## 2023-10-22 DIAGNOSIS — H52203 Unspecified astigmatism, bilateral: Secondary | ICD-10-CM | POA: Diagnosis not present

## 2023-10-22 DIAGNOSIS — H31002 Unspecified chorioretinal scars, left eye: Secondary | ICD-10-CM | POA: Diagnosis not present

## 2023-10-22 DIAGNOSIS — H5201 Hypermetropia, right eye: Secondary | ICD-10-CM | POA: Diagnosis not present

## 2023-10-22 DIAGNOSIS — E119 Type 2 diabetes mellitus without complications: Secondary | ICD-10-CM | POA: Diagnosis not present

## 2023-12-07 DIAGNOSIS — R946 Abnormal results of thyroid function studies: Secondary | ICD-10-CM | POA: Diagnosis not present

## 2023-12-07 DIAGNOSIS — Z79899 Other long term (current) drug therapy: Secondary | ICD-10-CM | POA: Diagnosis not present

## 2023-12-07 DIAGNOSIS — I10 Essential (primary) hypertension: Secondary | ICD-10-CM | POA: Diagnosis not present

## 2023-12-14 DIAGNOSIS — M1990 Unspecified osteoarthritis, unspecified site: Secondary | ICD-10-CM | POA: Diagnosis not present

## 2023-12-14 DIAGNOSIS — M4696 Unspecified inflammatory spondylopathy, lumbar region: Secondary | ICD-10-CM | POA: Diagnosis not present

## 2023-12-14 DIAGNOSIS — E139 Other specified diabetes mellitus without complications: Secondary | ICD-10-CM | POA: Diagnosis not present

## 2023-12-14 DIAGNOSIS — Z862 Personal history of diseases of the blood and blood-forming organs and certain disorders involving the immune mechanism: Secondary | ICD-10-CM | POA: Diagnosis not present

## 2023-12-14 DIAGNOSIS — E7800 Pure hypercholesterolemia, unspecified: Secondary | ICD-10-CM | POA: Diagnosis not present

## 2023-12-14 DIAGNOSIS — G720 Drug-induced myopathy: Secondary | ICD-10-CM | POA: Diagnosis not present

## 2023-12-14 DIAGNOSIS — Z0000 Encounter for general adult medical examination without abnormal findings: Secondary | ICD-10-CM | POA: Diagnosis not present

## 2023-12-14 DIAGNOSIS — Z9109 Other allergy status, other than to drugs and biological substances: Secondary | ICD-10-CM | POA: Diagnosis not present

## 2023-12-14 DIAGNOSIS — I10 Essential (primary) hypertension: Secondary | ICD-10-CM | POA: Diagnosis not present

## 2023-12-14 DIAGNOSIS — Z794 Long term (current) use of insulin: Secondary | ICD-10-CM | POA: Diagnosis not present

## 2023-12-14 DIAGNOSIS — M4698 Unspecified inflammatory spondylopathy, sacral and sacrococcygeal region: Secondary | ICD-10-CM | POA: Diagnosis not present

## 2024-02-14 DIAGNOSIS — E7800 Pure hypercholesterolemia, unspecified: Secondary | ICD-10-CM | POA: Diagnosis not present

## 2024-02-14 DIAGNOSIS — E139 Other specified diabetes mellitus without complications: Secondary | ICD-10-CM | POA: Diagnosis not present

## 2024-02-21 ENCOUNTER — Other Ambulatory Visit (HOSPITAL_COMMUNITY): Payer: Self-pay | Admitting: Endocrinology

## 2024-02-21 DIAGNOSIS — E139 Other specified diabetes mellitus without complications: Secondary | ICD-10-CM

## 2024-02-21 DIAGNOSIS — Z794 Long term (current) use of insulin: Secondary | ICD-10-CM | POA: Diagnosis not present

## 2024-02-21 DIAGNOSIS — I10 Essential (primary) hypertension: Secondary | ICD-10-CM | POA: Diagnosis not present

## 2024-02-21 DIAGNOSIS — Z6839 Body mass index (BMI) 39.0-39.9, adult: Secondary | ICD-10-CM | POA: Diagnosis not present

## 2024-02-21 DIAGNOSIS — Z789 Other specified health status: Secondary | ICD-10-CM | POA: Diagnosis not present

## 2024-02-21 DIAGNOSIS — E7800 Pure hypercholesterolemia, unspecified: Secondary | ICD-10-CM

## 2024-02-21 DIAGNOSIS — E559 Vitamin D deficiency, unspecified: Secondary | ICD-10-CM | POA: Diagnosis not present

## 2024-02-21 DIAGNOSIS — Z862 Personal history of diseases of the blood and blood-forming organs and certain disorders involving the immune mechanism: Secondary | ICD-10-CM | POA: Diagnosis not present

## 2024-04-07 ENCOUNTER — Ambulatory Visit (HOSPITAL_COMMUNITY)

## 2024-05-26 ENCOUNTER — Ambulatory Visit (HOSPITAL_COMMUNITY): Payer: Self-pay

## 2024-05-26 ENCOUNTER — Encounter (HOSPITAL_COMMUNITY): Payer: Self-pay
